# Patient Record
Sex: Male | Born: 1970
Health system: Southern US, Community
[De-identification: ages and names within clinical notes are randomized; demographics above are authoritative.]

## PROBLEM LIST (undated history)

## (undated) HISTORY — PX: NO PAST SURGERIES: SHX2092

---

## 2006-02-28 ENCOUNTER — Emergency Department (HOSPITAL_COMMUNITY): Admission: EM | Admit: 2006-02-28 | Discharge: 2006-03-01 | Payer: Self-pay | Admitting: Emergency Medicine

## 2006-09-22 ENCOUNTER — Ambulatory Visit: Payer: Self-pay | Admitting: Internal Medicine

## 2006-12-05 DIAGNOSIS — J343 Hypertrophy of nasal turbinates: Secondary | ICD-10-CM | POA: Insufficient documentation

## 2007-03-24 ENCOUNTER — Ambulatory Visit: Payer: Self-pay | Admitting: Nurse Practitioner

## 2007-03-29 ENCOUNTER — Ambulatory Visit: Payer: Self-pay | Admitting: Nurse Practitioner

## 2007-03-29 DIAGNOSIS — B009 Herpesviral infection, unspecified: Secondary | ICD-10-CM | POA: Insufficient documentation

## 2008-10-07 ENCOUNTER — Ambulatory Visit: Payer: Self-pay | Admitting: Nurse Practitioner

## 2008-10-07 DIAGNOSIS — S025XXA Fracture of tooth (traumatic), initial encounter for closed fracture: Secondary | ICD-10-CM | POA: Insufficient documentation

## 2008-10-11 ENCOUNTER — Encounter (INDEPENDENT_AMBULATORY_CARE_PROVIDER_SITE_OTHER): Payer: Self-pay | Admitting: Family Medicine

## 2014-10-10 ENCOUNTER — Emergency Department (HOSPITAL_COMMUNITY)
Admission: EM | Admit: 2014-10-10 | Discharge: 2014-10-11 | Disposition: A | Discharge status: Home / Self Care | Payer: No Typology Code available for payment source | Attending: Emergency Medicine | Admitting: Emergency Medicine

## 2014-10-10 ENCOUNTER — Encounter (HOSPITAL_COMMUNITY): Payer: Self-pay | Admitting: Emergency Medicine

## 2014-10-10 ENCOUNTER — Emergency Department (HOSPITAL_COMMUNITY): Payer: No Typology Code available for payment source

## 2014-10-10 DIAGNOSIS — S8002XA Contusion of left knee, initial encounter: Secondary | ICD-10-CM | POA: Insufficient documentation

## 2014-10-10 DIAGNOSIS — M545 Low back pain, unspecified: Secondary | ICD-10-CM

## 2014-10-10 DIAGNOSIS — Y999 Unspecified external cause status: Secondary | ICD-10-CM | POA: Diagnosis not present

## 2014-10-10 DIAGNOSIS — S8012XA Contusion of left lower leg, initial encounter: Secondary | ICD-10-CM | POA: Diagnosis not present

## 2014-10-10 DIAGNOSIS — S299XXA Unspecified injury of thorax, initial encounter: Secondary | ICD-10-CM | POA: Diagnosis not present

## 2014-10-10 DIAGNOSIS — S3992XA Unspecified injury of lower back, initial encounter: Secondary | ICD-10-CM | POA: Diagnosis present

## 2014-10-10 DIAGNOSIS — S199XXA Unspecified injury of neck, initial encounter: Secondary | ICD-10-CM | POA: Insufficient documentation

## 2014-10-10 DIAGNOSIS — Y92410 Unspecified street and highway as the place of occurrence of the external cause: Secondary | ICD-10-CM | POA: Insufficient documentation

## 2014-10-10 DIAGNOSIS — S8011XA Contusion of right lower leg, initial encounter: Secondary | ICD-10-CM | POA: Insufficient documentation

## 2014-10-10 DIAGNOSIS — Y9389 Activity, other specified: Secondary | ICD-10-CM | POA: Insufficient documentation

## 2014-10-10 DIAGNOSIS — S8001XA Contusion of right knee, initial encounter: Secondary | ICD-10-CM | POA: Diagnosis not present

## 2014-10-10 DIAGNOSIS — R1084 Generalized abdominal pain: Secondary | ICD-10-CM

## 2014-10-10 DIAGNOSIS — S0990XA Unspecified injury of head, initial encounter: Secondary | ICD-10-CM | POA: Insufficient documentation

## 2014-10-10 DIAGNOSIS — S3991XA Unspecified injury of abdomen, initial encounter: Secondary | ICD-10-CM | POA: Diagnosis not present

## 2014-10-10 MED ORDER — IOHEXOL 300 MG/ML  SOLN
100.0000 mL | Freq: Once | INTRAMUSCULAR | Status: AC | PRN
  Administered 2014-10-10: 100 mL via INTRAVENOUS

## 2014-10-10 MED ORDER — FENTANYL CITRATE (PF) 100 MCG/2ML IJ SOLN
50.0000 ug | Freq: Once | INTRAMUSCULAR | Status: AC
  Administered 2014-10-10: 50 ug via INTRAVENOUS
  Filled 2014-10-10: qty 2

## 2014-10-10 MED ORDER — SODIUM CHLORIDE 0.9 % IV SOLN
INTRAVENOUS | Status: DC
Start: 2014-10-10 — End: 2014-10-11
  Administered 2014-10-10: 23:00:00 via INTRAVENOUS

## 2014-10-10 MED ORDER — ONDANSETRON HCL 4 MG/2ML IJ SOLN
4.0000 mg | Freq: Once | INTRAMUSCULAR | Status: AC
  Administered 2014-10-10: 4 mg via INTRAVENOUS
  Filled 2014-10-10: qty 2

## 2014-10-10 MED ORDER — SODIUM CHLORIDE 0.9 % IV BOLUS (SEPSIS)
1000.0000 mL | Freq: Once | INTRAVENOUS | Status: AC
  Administered 2014-10-10: 1000 mL via INTRAVENOUS
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
## 2014-10-11 LAB — BASIC METABOLIC PANEL
Anion gap: 10 (ref 5–15)
BUN: 10 mg/dL (ref 6–20)
CALCIUM: 8.6 mg/dL — AB (ref 8.9–10.3)
CHLORIDE: 101 mmol/L (ref 101–111)
CO2: 24 mmol/L (ref 22–32)
CREATININE: 0.93 mg/dL (ref 0.61–1.24)
GFR calc Af Amer: >60 mL/min (ref >60)
GFR calc non Af Amer: >60 mL/min (ref >60)
Glucose, Bld: 112 mg/dL — ABNORMAL HIGH (ref 65–99)
Potassium: 3.2 mmol/L — ABNORMAL LOW (ref 3.5–5.1)
SODIUM: 135 mmol/L (ref 135–145)

## 2014-10-11 LAB — CBC WITH DIFFERENTIAL/PLATELET
Basophils Absolute: 0 10*3/uL (ref 0.0–0.1)
Basophils Relative: 0 % (ref 0–1)
EOS ABS: 0.1 10*3/uL (ref 0.0–0.7)
Eosinophils Relative: 1 % (ref 0–5)
HEMATOCRIT: 37.7 % — AB (ref 39.0–52.0)
HEMOGLOBIN: 13 g/dL (ref 13.0–17.0)
LYMPHS ABS: 1.9 10*3/uL (ref 0.7–4.0)
Lymphocytes Relative: 23 % (ref 12–46)
MCH: 29.8 pg (ref 26.0–34.0)
MCHC: 34.5 g/dL (ref 30.0–36.0)
MCV: 86.5 fL (ref 78.0–100.0)
MONO ABS: 1 10*3/uL (ref 0.1–1.0)
MONOS PCT: 13 % — AB (ref 3–12)
NEUTROS PCT: 63 % (ref 43–77)
Neutro Abs: 5.2 10*3/uL (ref 1.7–7.7)
Platelets: 285 10*3/uL (ref 150–400)
RBC: 4.36 MIL/uL (ref 4.22–5.81)
RDW: 14.8 % (ref 11.5–15.5)
WBC: 8.2 10*3/uL (ref 4.0–10.5)

## 2014-10-11 MED ORDER — FENTANYL CITRATE (PF) 100 MCG/2ML IJ SOLN
50.0000 ug | Freq: Once | INTRAMUSCULAR | Status: AC
  Administered 2014-10-11: 50 ug via INTRAVENOUS
  Filled 2014-10-11: qty 2

## 2014-10-11 MED ORDER — NAPROXEN 500 MG PO TABS: 500.0000 mg | ORAL_TABLET | Freq: Two times a day (BID) | ORAL | Status: DC

## 2014-10-11 MED ORDER — HYDROCODONE-ACETAMINOPHEN 5-325 MG PO TABS: 1.0000 | ORAL_TABLET | Freq: Four times a day (QID) | ORAL | Status: DC | PRN
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
## 2014-10-11 NOTE — Discharge Instructions (Signed)
Take Naprosyn on a regular basis. Supplement with hydrocodone as needed for additional pain control. Expect to be very stiff and sore the next few days. CT scan workup here to include head neck chest abdomen and pelvis was negative.  Work note provided.

## 2017-02-28 ENCOUNTER — Ambulatory Visit: Payer: Self-pay | Attending: Internal Medicine

## 2017-03-10 ENCOUNTER — Other Ambulatory Visit: Payer: Self-pay

## 2017-03-10 ENCOUNTER — Encounter (INDEPENDENT_AMBULATORY_CARE_PROVIDER_SITE_OTHER): Payer: Self-pay | Admitting: Physician Assistant

## 2017-03-10 ENCOUNTER — Ambulatory Visit (INDEPENDENT_AMBULATORY_CARE_PROVIDER_SITE_OTHER): Payer: Self-pay | Admitting: Physician Assistant

## 2017-03-10 VITALS — BP 159/77 | HR 71 | Temp 98.4°F | Ht 63.0 in | Wt 190.4 lb

## 2017-03-10 DIAGNOSIS — K089 Disorder of teeth and supporting structures, unspecified: Secondary | ICD-10-CM

## 2017-03-10 DIAGNOSIS — G8929 Other chronic pain: Secondary | ICD-10-CM

## 2017-03-10 DIAGNOSIS — B351 Tinea unguium: Secondary | ICD-10-CM

## 2017-03-10 MED ORDER — NAPROXEN 500 MG PO TABS: 500.0000 mg | ORAL_TABLET | Freq: Two times a day (BID) | ORAL | 0 refills | Status: DC

## 2017-03-10 MED ORDER — AMOXICILLIN-POT CLAVULANATE 875-125 MG PO TABS
1.0000 | ORAL_TABLET | Freq: Two times a day (BID) | ORAL | 2 refills | Status: DC
Start: 2017-03-10 — End: 2017-06-13

## 2017-03-10 MED ORDER — TERBINAFINE HCL 250 MG PO TABS: 250.0000 mg | ORAL_TABLET | Freq: Every day | ORAL | 0 refills | Status: DC

## 2017-03-10 MED ORDER — BENZOCAINE 10 % MT GEL: 1.0000 "application " | OROMUCOSAL | 0 refills | Status: DC | PRN

## 2017-03-10 NOTE — Progress Notes (Signed)
Subjective:  Patient ID: Benjamin Mccarthy, male    DOB: [birth year]  Age: 47 y.o. MRN: [id number]  CC: dental pain and nail problem  HPI Benjamin Mccarthy is a 47 y.o. male with no significant medical history presents with chronic dental pain since one year ago. Pain in the front lower teeth has intensified over the last two months. The two lower incisors are loose and the gum is severely receded. Painful to chew and drink. Extremely sensitive to air. Does not endorse CP, palpitations, f/c/n/v, suppuration, abscess, facial swelling, or swelling in the oral cavity.    Also complains of infection of the right thumb. Thinks he has a fungal infection of the right thumbnail. Initially lost his nail years ago when he struck it with a hammer. The nail grew in dystrophic and blackened. Has remained damaged ever since. Tried OTC antifungal solutions with no relief.        Outpatient Medications Prior to Visit  Medication Sig Dispense Refill  . HYDROcodone-acetaminophen (NORCO/VICODIN) 5-325 MG per tablet Take 1-2 tablets by mouth every 6 (six) hours as needed for moderate pain. 20 tablet 0  . naproxen (NAPROSYN) 500 MG tablet Take 1 tablet (500 mg total) by mouth 2 (two) times daily. 14 tablet 0   No facility-administered medications prior to visit.      ROS Review of Systems  Constitutional: Negative for chills, fever and malaise/fatigue.  HENT:       Dental pain  Eyes: Negative for blurred vision.  Respiratory: Negative for shortness of breath.   Cardiovascular: Negative for chest pain and palpitations.  Gastrointestinal: Negative for abdominal pain and nausea.  Genitourinary: Negative for dysuria and hematuria.  Musculoskeletal: Negative for joint pain and myalgias.  Skin: Negative for rash.       Blackened thumbnail.   Neurological: Negative for tingling and headaches.  Psychiatric/Behavioral: Negative for depression. The patient is not nervous/anxious.     Objective:  BP (!) 159/77 (BP  Location: Left Arm, Patient Position: Sitting, Cuff Size: Normal)   Pulse 71   Temp 98.4 F (36.9 C) (Oral)   Ht 5\' 3"  (1.6 m)   Wt 190 lb 6.4 oz (86.4 kg)   SpO2 96%   BMI 33.73 kg/m   BP/Weight 03/10/2017 10/11/2014 10/07/2008  Systolic BP 159 108 127  Diastolic BP 77 62 84  Wt. (Lbs) 190.4 - 182  BMI 33.73 - 33.03      Physical Exam  Constitutional: He is oriented to person, place, and time.  Well developed, well nourished, NAD, polite  HENT:  Head: Normocephalic and atraumatic.  No facial, neck, or oral cavity edema. The two lower incisors are loose and the gum is severely receded.   Eyes: No scleral icterus.  Neck: Normal range of motion. Neck supple. No thyromegaly present.  Cardiovascular: Normal rate, regular rhythm and normal heart sounds.  Pulmonary/Chest: Effort normal and breath sounds normal.  Musculoskeletal: He exhibits no edema.  Lymphadenopathy:    He has no cervical adenopathy.  Neurological: He is alert and oriented to person, place, and time. No cranial nerve deficit. Coordination normal.  Skin: Skin is warm and dry. No rash noted. No erythema. No pallor.  Right thumb with dystrophic and blackened nail.   Psychiatric: He has a normal mood and affect. His behavior is normal. Thought content normal.  Vitals reviewed.    Assessment & Plan:   1. Chronic dental pain - Patient referred to dentistry. Also gave patient advise to go to [email or web address]  to try and attain care with a dentist that would accept uninsured patients.  - amoxicillin-clavulanate (AUGMENTIN) 875-125 MG tablet; Take 1 tablet by mouth 2 (two) times daily.  Dispense: 20 tablet; Refill: 2 - naproxen (NAPROSYN) 500 MG tablet; Take 1 tablet (500 mg total) by mouth 2 (two) times daily with a meal.  Dispense: 30 tablet; Refill: 0 - benzocaine (ORAJEL) 10 % mucosal gel; Use as directed 1 application in the mouth or throat as needed for mouth pain.  Dispense: 5.3 g; Refill: 0  2. Onychomycosis -  Comprehensive metabolic panel - Begin terbinafine (LAMISIL) 250 MG tablet; Take 1 tablet (250 mg total) by mouth daily.  Dispense: 90 tablet; Refill: 0. Strongly advised to wait to hear of liver results before filling terbinafine. Patient voiced understanding.    Meds ordered this encounter  Medications  . terbinafine (LAMISIL) 250 MG tablet    Sig: Take 1 tablet (250 mg total) by mouth daily.    Dispense:  90 tablet    Refill:  0    Order Specific Question:   Supervising Provider    Answer:   Quentin Angst [id number]  . amoxicillin-clavulanate (AUGMENTIN) 875-125 MG tablet    Sig: Take 1 tablet by mouth 2 (two) times daily.    Dispense:  20 tablet    Refill:  2    Order Specific Question:   Supervising Provider    Answer:   Quentin Angst [id number]  . naproxen (NAPROSYN) 500 MG tablet    Sig: Take 1 tablet (500 mg total) by mouth 2 (two) times daily with a meal.    Dispense:  30 tablet    Refill:  0    Order Specific Question:   Supervising Provider    Answer:   Quentin Angst [id number]  . benzocaine (ORAJEL) 10 % mucosal gel    Sig: Use as directed 1 application in the mouth or throat as needed for mouth pain.    Dispense:  5.3 g    Refill:  0    Order Specific Question:   Supervising Provider    Answer:   Quentin Angst [[id number]]    Follow-up: Return in about 3 months (around 06/08/2017) for Full physical.   Loletta Specter PA

## 2017-03-10 NOTE — Patient Instructions (Addendum)
La direction del red para el dentista:  [email or web address]  Por favor no empiezes la Terbinafine antes de oir de los resultados del higado.    Infeccin por hongos en las uas (Fungal Nail Infection) La infeccin por hongos en las uas es una infeccin por hongos frecuente en las uas de los pies o de las manos. Este trastorno [employer] uas de los pies con ms frecuencia que las uas de las manos. Ms de Neomia Dear ua puede infectarse. Esta afeccin puede transmitirse de [place] persona a otra (es  contagiosa). CAUSAS La causa de esta afeccin es un hongo. Existen distintos tipos de hongos que pueden causar la infeccin. Estos hongos son ms frecuentes en las zonas hmedas y clidas. Si las manos o los pies entran en contacto con los hongos, se pueden introducir en una ruptura de las uas de las manos o de los pies y Games developer infeccin. FACTORES DE RIESGO Los siguientes factores pueden hacer que usted sea propenso a sufrir esta afeccin:  Ser varn.  Tener diabetes.  Ser Neomia Dear persona de edad avanzada.  Convivir con alguien que tiene hongos.  Caminar descalzo en zonas donde proliferan hongos, como duchas o vestuarios.  Tener mala circulacin.  Usar zapatos y calcetines que [employer].  Tener pie de atleta.  Tener una ua lastimada o antecedentes recientes de una ciruga de uas.  Tener psoriasis.  Debilitamiento del sistema de defensa del cuerpo (sistema inmunitario). SNTOMAS Los sntomas de esta afeccin incluyen lo siguiente:  Cyndia Diver plida sobre la ua.  Engrosamiento de la ua.  Una ua que se torna amarilla o [place].  Bordes de las uas rugosos o quebradizos.  Una ua que se cae.  Una ua que se ha desprendido del lecho ungueal. DIAGNSTICO Esta afeccin se diagnostica mediante un examen fsico. El mdico podr tomar una muestra de la ua para examinarla y Engineer, manufacturing si tiene hongos. TRATAMIENTO Las infecciones leves no necesitan tratamiento. Si tiene  [employer] uas, el tratamiento puede incluir lo siguiente:  Medicamentos antimicticos por va oral. Deber tomar los medicamentos durante algunas semanas o meses y no ver los resultados hasta despus de un largo [place]. Estos medicamentos pueden tener efectos secundarios. Consulte al [employer] a los que debe estar atento.  Cremas y esmaltes para uas antimicticos. Se pueden usar junto con los medicamentos antimicticos que se administran por va oral.  Tratamiento lser de las uas.  Ciruga para extirpar la ua. Esto puede ser [employer] casos ms graves de infecciones. El tratamiento es muy largo y la infeccin Metallurgist. INSTRUCCIONES PARA EL CUIDADO EN EL HOGAR Medicamentos  Tome o aplquese los medicamentos de venta libre y Science writer se lo haya indicado el mdico.  Consulte al mdico sobre el uso de pomadas mentoladas para las uas de [place]. Estilo de vida  No comparta elementos personales como toallas o cortauas.  Crtese las uas con frecuencia.  Lvese y squese las manos y los pies todos [place].  Use calcetines absorbentes y cmbiese los calcetines con frecuencia.  Use un tipo de calzado que permita que el aire [place], como sandalias o zapatillas de lona. Deseche los zapatos viejos.  Use guantes de goma si est trabajando con sus manos en lugares mojados.  No camine descalzo en duchas o vestuarios.  No concurra a un saln de cosmtica de uas si no usan instrumentos limpios.  No use uas artificiales. Instrucciones generales  Concurra a todas  las visitas de control como se lo haya indicado el mdico. Esto es importante.  Aplquese polvo antimictico en los pies y en los zapatos. SOLICITE ATENCIN MDICA SI: La infeccin no mejora o si empeora despus de varios meses. Esta informacin no tiene Theme park managercomo fin reemplazar el consejo del mdico. Asegrese de hacerle al mdico cualquier pregunta que  tenga. Document Released: 11/18/2004 Document Revised: 06/02/2015 Document Reviewed: 08/12/2014 Elsevier Interactive Patient Education  [employer]2018 Elsevier Inc.

## 2017-03-11 LAB — COMPREHENSIVE METABOLIC PANEL
ALT: 38 IU/L (ref 0–44)
AST: 31 IU/L (ref 0–40)
Albumin/Globulin Ratio: 1.3 (ref 1.2–2.2)
Albumin: 4.5 g/dL (ref 3.5–5.5)
Alkaline Phosphatase: 136 IU/L — ABNORMAL HIGH (ref 39–117)
BUN/Creatinine Ratio: 12 (ref 9–20)
BUN: 10 mg/dL (ref 6–24)
Bilirubin Total: 0.5 mg/dL (ref 0.0–1.2)
CALCIUM: 9.3 mg/dL (ref 8.7–10.2)
CO2: 22 mmol/L (ref 20–29)
CREATININE: 0.81 mg/dL (ref 0.76–1.27)
Chloride: 99 mmol/L (ref 96–106)
GFR calc Af Amer: 123 mL/min/{1.73_m2} (ref >59)
GFR, EST NON AFRICAN AMERICAN: 107 mL/min/{1.73_m2} (ref >59)
Globulin, Total: 3.6 g/dL (ref 1.5–4.5)
Glucose: 125 mg/dL — ABNORMAL HIGH (ref 65–99)
POTASSIUM: 4.2 mmol/L (ref 3.5–5.2)
Sodium: 139 mmol/L (ref 134–144)
Total Protein: 8.1 g/dL (ref 6.0–8.5)

## 2017-03-14 ENCOUNTER — Other Ambulatory Visit: Payer: Self-pay

## 2017-03-14 ENCOUNTER — Ambulatory Visit (INDEPENDENT_AMBULATORY_CARE_PROVIDER_SITE_OTHER): Payer: Self-pay | Admitting: Physician Assistant

## 2017-03-14 ENCOUNTER — Telehealth: Payer: Self-pay

## 2017-03-14 ENCOUNTER — Encounter (INDEPENDENT_AMBULATORY_CARE_PROVIDER_SITE_OTHER): Payer: Self-pay | Admitting: Physician Assistant

## 2017-03-14 VITALS — BP 116/70 | HR 61 | Temp 97.4°F | Wt 192.2 lb

## 2017-03-14 DIAGNOSIS — R7303 Prediabetes: Secondary | ICD-10-CM

## 2017-03-14 DIAGNOSIS — R739 Hyperglycemia, unspecified: Secondary | ICD-10-CM

## 2017-03-14 LAB — POCT GLYCOSYLATED HEMOGLOBIN (HGB A1C): Hemoglobin A1C: 6.4

## 2017-03-14 MED ORDER — METFORMIN HCL 500 MG PO TABS: 500.0000 mg | ORAL_TABLET | Freq: Two times a day (BID) | ORAL | 5 refills | Status: DC

## 2017-03-14 NOTE — Telephone Encounter (Signed)
Using pacific interpreter [phone]) Benjamin Mccarthy, patient informed of elevated glucose and the need for A1c to check for diabetes. Patient scheduled to come into clinic today for A1c. Maryjean Mornempestt S Roberts, CMA

## 2017-03-14 NOTE — Patient Instructions (Signed)
Metformin tablets Qu es este medicamento? La METFORMINA se [place] para tratar la diabetes tipo 2. Ayuda a Advice worker de Dispensing optician. El tratamiento se [place] con ejercicios y [place]. Este [employer] se puede usar solo o con otros medicamentos para la diabetes, incluyendo la insulina. Este medicamento puede ser utilizado para otros usos; si tiene alguna pregunta consulte con su proveedor de atencin mdica o con su farmacutico. MARCAS COMUNES: Glucophage Qu le debo informar a mi profesional de la salud antes de tomar este medicamento? Necesita saber si usted presenta alguno de los siguientes problemas o situaciones: -anemia -si consume bebidas alcohlicas con frecuencia -se deshidrata con facilidad -ataque cardiaco -insuficiencia cardiaca tratada con medicamentos -enfermedad renal -enfermedad heptica -ovarios poliqusticos -infeccin o lesin severa -vmito -una reaccin alrgica o inusual a la metformina, a otros medicamentos, alimentos, colorantes o conservantes -si est embarazada o buscando quedar embarazada -si est amamantando a un beb Cmo debo utilizar este medicamento? Tome este medicamento por va oral. Tmelo con alimentos. Trague las tabletas con agua. Siga las instrucciones de la etiqueta del [place]. Tome su medicamento a intervalos regulares. No tome su medicamento con una frecuencia mayor a la indicada. Hable con su pediatra para informarse acerca del uso de este medicamento en nios. Aunque este medicamento se puede recetar a nios tan pequeos como de 10 aos de edad en casos selectos, existen precauciones que deben tomarse. Sobredosis: Pngase en contacto inmediatamente con un centro toxicolgico o una sala de urgencia si usted cree que haya tomado demasiado medicamento. ATENCIN: [employer] es solo para usted. No comparta este medicamento con nadie. Qu sucede si me olvido de una dosis? Si olvida una dosis, tmela lo antes posible. Si  es casi la hora de su dosis siguiente, tome slo esa dosis. No tome dosis adicionales o dobles. Qu puede interactuar con este medicamento? No tome esta medicina con ninguno de los siguientes medicamentos: -dofetilida -gatifloxacino -ciertos agentes de contraste administrados antes de un procedimiento con rayos X, tomografas computadas (CT), MRI u otros procedimientos Esta medicina tambin puede interactuar con los siguientes medicamentos: -acetazolamida -ciertos medicamentos para infeccin por VIH o hepatitis, tales como adefovir, emtricitabina, entecavir, lamivudina o tenofovir -cimetidina -crizotinib -digoxina -diurticos -hormonas femeninas, como estrgenos o progestinas y pldoras anticonceptivas -glucopirrolato -isoniazida -lamotrigina -medicamentos para presin sangunea, enfermedad cardiaca, pulso cardiaco irregular -memantina -midodrina -metazolamida -morfina -cido nicotnico -fenotiazinas, tales como clorpromacina, mesoridazina, proclorperazina, tioridazina -fenitona -procainamida -propantelina -quinidina -quinina -ranitidina -ranolazina -medicamentos esteroideos, como prednisona o cortisona -medicamentos estimulantes para trastornos de Freight forwarder, perder peso o mantenerse despierto -medicamentos tiroideos -topiramato -trimetoprima -trospio -vancomicina -vandetanib -zonisamida Puede ser que esta lista no menciona todas las posibles interacciones. Informe a su profesional de [employer]	[place] de [employer] productos a base de hierbas, medicamentos de [place] o suplementos nutritivos que est tomando. Si usted fuma, consume bebidas alcohlicas o si utiliza drogas ilegales, indqueselo tambin a su profesional de [employer]	[place]. Algunas sustancias pueden interactuar con su medicamento. A qu debo estar atento al usar Coca[employer]a? Visite a su mdico o a su profesional de la salud para chequear su evolucin peridicamente. Un examen llamado HbA1C (A1C) ser monitoreado. Es  un simple examen de [place]. Mide su control de azcar en la sangre durante los ltimos 2 a 3 meses. Usted recibir [employer] cada 3 a 6 meses. Aprenda cmo controlar el nivel de azcar en la sangre. Aprenda a reconocer los sntomas de bajo y alto nivel de azcar en la sangre y cmo tratarlos.  Siempre lleve consigo una fuente rpida de azcar por si acaso experimenta sntomas de bajo nivel de azcar en la sangre. Ejemplos incluyen caramelos duros o tabletas de glucosa. Asegrese de que los miembros de su familia sepan que se puede ahogar si come o bebe mientras tiene sntomas graves de bajo nivel de azcar en la sangre, tales como convulsiones o prdida del conocimiento. Deben obtener ayuda mdica inmediatamente. Informe a su mdico o a su profesional de la salud si tiene alto nivel de Dispensing optician. Tal vez sea necesario cambiar la dosis de su medicamento. Si est enfermo o haciendo mucho ms ejercicio que el habitual, puede ser necesario cambiar la dosis de su medicamento. No se salte comidas. Pregunte a su mdico o a su profesional de la salud si debe evitar el consumo de alcohol. Muchos productos de venta libre para tos y resfros contienen azcar y alcohol. Estos pueden Magazine features editor de azcar en la sangre. Este medicamento puede provocar la ovulacin en mujeres premenopusicas que no tienen periodos menstruales regulares. Esto puede aumentar la posibilidad de [place]. No debe tomar este medicamento si se queda embarazada o si cree que est embarazada. Consulte a su mdico o su profesional de la salud sobre sus opciones anticonceptivas mientras est tomando Coca[employer]Cola. Si cree que est embarazada, consulte a su mdico o su profesional de la salud inmediatamente. Si va a someterse a una operacin, IRM (MRI), tomografa computarizada u otro procedimiento, informe a su mdico que est tomando Coca[employer]Cola. Usted podr necesitar dejar de tomar este medicamento antes del  procedimiento. Use una pulsera o cadena de identificacin mdica. Lleve consigo una tarjeta de identificacin con informacin sobre su enfermedad y Scientist, research (medical) de sus medicamentos y los horarios de las dosis. Qu efectos secundarios puedo tener al [employer] este medicamento? Efectos secundarios que debe informar a su mdico o a Barrister's clerk de la salud tan pronto como sea posible: -Chief of Staff como erupcin cutnea, picazn o urticarias, hinchazn de la cara, labios o lengua -problemas respiratorios -sensacin de desmayos o aturdimiento, cadas -dolores o molestias musculares -signos o sntomas de bajo nivel de azcar en la sangre tales como sentirse ansioso, confusin, mareos, aumento de apetito, debilidad o cansancio inusual, sudoracin, temblores, fro, irritabilidad, dolor de cabeza, visin borrosa, pulso cardaco rpido, prdida del conocimiento -pulso cardiaco irregular o lento -molestias o dolor de estmago inusual -cansancio o debilidad inusual Efectos secundarios que, por lo general, no requieren atencin mdica (debe informarlos a su mdico o a su profesional de la salud si persisten o si son molestos): -diarrea -dolor de cabeza Victorio Palm de estmago -sabor metlico en la boca -nuseas -molestias estomacales, gases Puede ser que esta lista no menciona todos los posibles efectos secundarios. Comunquese a su mdico por asesoramiento mdico [employer]. Usted puede informar los efectos secundarios a la FDA por telfono al 1-800-FDA-1088. Dnde debo guardar mi medicina? Mantngala fuera del alcance de los nios. Gurdela a [employer], entre 15 y 67 grados C (107 y 44 grados F). Protjala de la humedad y de Naval architect. Deseche todo el medicamento que no haya utilizado, despus de la fecha de vencimiento. ATENCIN: Este folleto es un resumen. Puede ser que no cubra toda la posible informacin. Si usted tiene preguntas acerca de esta medicina, consulte con su  mdico, su farmacutico o su profesional de Technical sales engineer.  2018 Elsevier/Gold Standard (2016-03-11 00:00:00) Plan de alimentacin para la prediabetes (Prediabetes Eating Plan) La prediabetes, tambin llamada intolerancia a la  glucosa o alteracin de la glucosa en ayunas, es una afeccin que eleva los niveles de azcar en la sangre (glucemia) por encima de lo normal. Seguir una dieta saludable puede ayudar a mantener la prediabetes bajo control, y tambin reduce el riesgo de tener diabetes tipo2 y cardiopata, que es ms alto en las personas que tienen esta afeccin. Junto con la actividad fsica habitual, una dieta saludable:  Promueve la prdida de [place].  Ayuda a Environmental consultant de Dispensing optician.  Ayuda a mejorar la forma en que el organismo [place] la insulina. QU DEBO SABER ACERCA DE ESTE PLAN DE [place]?  Use el ndice glucmico (IG) para planificar las comidas. El ndice le informa con qu rapidez un alimento elevar su nivel de azcar en la sangre. Elija los alimentos con bajo IG. Estos tardan ms tiempo en subir el nivel de azcar en la sangre.  Preste mucha atencin a la cantidad de hidratos de carbono que hay en los alimentos que consume. Los hidratos de carbono [employer] niveles de Dispensing optician.  Lleve un registro de la cantidad de caloras que ingiere. Ingerir la cantidad correcta de caloras lo ayudar a Development worker, international aid peso saludable. Bajar alrededor del 7por ciento del peso inicial puede ayudar a Product/process development scientist la diabetes tipo2.  Tal vez deba seguir Web designer. Esta incluye una gran cantidad de verduras, carnes magras o pescado, cereales integrales, frutas, as como aceites y grasas saludables.  QU ALIMENTOS PUEDO COMER? Cereales Cereales integrales, como panes, galletas, cereales y pastas de salvado o integrales. Avena sin azcar. Trigo burgol. Cebada. Quinua. Arroz integral. Tortillas o tacos de harina de maz o de salvado. [place] Valeda Malm.  [place]. Guisantes. Remolachas. Coliflor. Repollo. Brcoli. Zanahorias. Tomates. [place]. [place]. Hierbas. Pimientos. Cebollas. Pepinos. Repollitos de Bruselas. Frutas Frutos rojos. Bananas. Manzanas. Naranjas. Uvas. Papaya. Mango. Shell. Kiwi. Pomelo. Cerezas. Carnes y otras fuentes de protenas Mariscos. Carnes [place], entre ellas, pollo y [place] o cortes magros de carne de cerdo y de [place]. Tofu. Huevos. Los frutos secos. Frijoles. Lcteos Productos lcteos descremados o semidescremados, como yogur, queso cottage y [place]. [employer]. T. Caf. Gaseosas sin azcar o dietticas. Agua de [place]. Leche. Productos alternativos [place]s, como leche de soja o de [place]. Condimentos [place]. Salsa de pepinillos. [place] con bajo contenido de [place] y de Location manager. Salsa barbacoa con bajo contenido de grasa y de azcar. [place]onesa sin grasa o con bajo contenido de [place]. Dulces y postres [place] sin azcar o con bajo contenido de [place]. Helados y otros dulces congelados sin azcar o con bajo contenido de [place]. Grasas y Medical laboratory scientific officer. Nueces. Aceite de oliva. Los artculos mencionados arriba pueden no ser [employer] de las bebidas o los alimentos recomendados. Comunquese con el nutricionista para conocer ms opciones. QU ALIMENTOS NO SE RECOMIENDAN? Cereales Productos a base de [place] y de [place], como panes, pastas, bocadillos y cereales. Bebidas Bebidas azucaradas, como t helado y gaseosas con Location manager. Dulces y postres Productos de [place], [place] tortas, [place], [place], Museum/gallery exhibitions officer y tarta de [place]. Los artculos mencionados arriba pueden no ser [employer] de las bebidas y los alimentos que se Higher education careers adviser. Comunquese con el nutricionista para obtener ms informacin. Esta informacin no tiene Marine scientist el consejo del mdico. Asegrese de hacerle al mdico cualquier pregunta que tenga. Document Released: 10/30/2014 Document Revised:  10/30/2014 Document Reviewed: 03/06/2014 Elsevier Interactive Patient Education  2017 [employer].

## 2017-03-14 NOTE — Telephone Encounter (Signed)
-----   Message from Loletta Specteroger David Gomez, PA-C sent at 03/11/2017  8:18 AM EST ----- Will need A1c because his glucose is elevated.

## 2017-03-14 NOTE — Progress Notes (Signed)
   Subjective:  Patient ID: Benjamin Mccarthy, male    DOB: [birth year]  Age: 47 y.o. MRN: [id number]  CC: f/u glucose  HPI Benjamin Mccarthy is a [age] y.o. male with a medical history of chronic dental pain presents to have an A1c POCT drawn. He was incidentally found to have a glucose of 125 mg/dL. Does not endorse polydipsia, polyuria, polyphagia, fatigue, visual blurring, or tingling/numbness.       Outpatient Medications Prior to Visit  Medication Sig Dispense Refill  . amoxicillin-clavulanate (AUGMENTIN) 875-125 MG tablet Take 1 tablet by mouth 2 (two) times daily. 20 tablet 2  . benzocaine (ORAJEL) 10 % mucosal gel Use as directed 1 application in the mouth or throat as needed for mouth pain. 5.3 g 0  . HYDROcodone-acetaminophen (NORCO/VICODIN) 5-325 MG per tablet Take 1-2 tablets by mouth every 6 (six) hours as needed for moderate pain. 20 tablet 0  . naproxen (NAPROSYN) 500 MG tablet Take 1 tablet (500 mg total) by mouth 2 (two) times daily with a meal. 30 tablet 0  . terbinafine (LAMISIL) 250 MG tablet Take 1 tablet (250 mg total) by mouth daily. 90 tablet 0   No facility-administered medications prior to visit.      ROS Review of Systems  Constitutional: Negative for chills, fever and malaise/fatigue.  Eyes: Negative for blurred vision.  Respiratory: Negative for shortness of breath.   Cardiovascular: Negative for chest pain and palpitations.  Gastrointestinal: Negative for abdominal pain and nausea.  Genitourinary: Negative for dysuria and hematuria.  Musculoskeletal: Negative for joint pain and myalgias.  Skin: Negative for rash.  Neurological: Negative for tingling and headaches.  Psychiatric/Behavioral: Negative for depression. The patient is not nervous/anxious.     Objective:  There were no vitals taken for this visit.  BP/Weight 03/10/2017 10/11/2014 10/07/2008  Systolic BP 159 108 127  Diastolic BP 77 62 84  Wt. (Lbs) 190.4 - 182  BMI 33.73 - 33.03      Physical Exam   Constitutional: He is oriented to person, place, and time.  Well developed, well nourished, NAD, polite  HENT:  Head: Normocephalic and atraumatic.  Eyes: No scleral icterus.  Neck: Normal range of motion. Neck supple. No thyromegaly present.  Pulmonary/Chest: Effort normal.  Neurological: He is alert and oriented to person, place, and time.  Skin: Skin is warm and dry. No rash noted. No erythema. No pallor.  Right thumb nail dystrophic and blackened  Psychiatric: He has a normal mood and affect. His behavior is normal. Thought content normal.  Vitals reviewed.    Assessment & Plan:     1. Prediabetes - Begin metFORMIN (GLUCOPHAGE) 500 MG tablet; Take 1 tablet (500 mg total) by mouth 2 (two) times daily with a meal.  Dispense: 60 tablet; Refill: 5  2. Hyperglycemia - HgB A1c 6.4% in clinic today      Meds ordered this encounter  Medications  . metFORMIN (GLUCOPHAGE) 500 MG tablet    Sig: Take 1 tablet (500 mg total) by mouth 2 (two) times daily with a meal.    Dispense:  60 tablet    Refill:  5    Order Specific Question:   Supervising Provider    Answer:   Quentin AngstJEGEDE, OLUGBEMIGA E [[id number]]    Follow-up: Return for keep f/u appt.   Loletta Specteroger David Elizar Alpern PA

## 2017-06-13 ENCOUNTER — Ambulatory Visit (INDEPENDENT_AMBULATORY_CARE_PROVIDER_SITE_OTHER): Payer: Self-pay | Admitting: Physician Assistant

## 2017-06-13 ENCOUNTER — Other Ambulatory Visit: Payer: Self-pay

## 2017-06-13 ENCOUNTER — Encounter (INDEPENDENT_AMBULATORY_CARE_PROVIDER_SITE_OTHER): Payer: Self-pay | Admitting: Physician Assistant

## 2017-06-13 VITALS — BP 139/89 | HR 86 | Temp 98.1°F | Ht 63.0 in | Wt 186.4 lb

## 2017-06-13 DIAGNOSIS — Z23 Encounter for immunization: Secondary | ICD-10-CM

## 2017-06-13 DIAGNOSIS — Z114 Encounter for screening for human immunodeficiency virus [HIV]: Secondary | ICD-10-CM

## 2017-06-13 DIAGNOSIS — K089 Disorder of teeth and supporting structures, unspecified: Secondary | ICD-10-CM

## 2017-06-13 DIAGNOSIS — K047 Periapical abscess without sinus: Secondary | ICD-10-CM

## 2017-06-13 DIAGNOSIS — G8929 Other chronic pain: Secondary | ICD-10-CM

## 2017-06-13 MED ORDER — NAPROXEN 500 MG PO TABS
500.0000 mg | ORAL_TABLET | Freq: Two times a day (BID) | ORAL | 3 refills | Status: DC
Start: 2017-06-13 — End: 2017-10-03

## 2017-06-13 MED ORDER — ACETAMINOPHEN-CODEINE #3 300-30 MG PO TABS: 1.0000 | ORAL_TABLET | Freq: Four times a day (QID) | ORAL | 0 refills | Status: AC | PRN

## 2017-06-13 MED ORDER — AMOXICILLIN-POT CLAVULANATE 875-125 MG PO TABS: 1.0000 | ORAL_TABLET | Freq: Two times a day (BID) | ORAL | 3 refills | Status: DC

## 2017-06-13 NOTE — Patient Instructions (Addendum)
Por favor llame a [facility] [phone]  Absceso dental (Dental Abscess) Un absceso dental es pus en una pieza dental o alrededor de esta. CUIDADOS EN EL HOGAR  Tome los medicamentos solamente como se lo haya indicado el dentista.  Si le recetaron antibiticos, asegrese de terminarlos, incluso si comienza a Actorsentirse mejor.  Enjuguese la boca (haga grgaras) frecuentemente con agua con sal.  No conduzca ni use maquinaria pesada, como una cortadora de csped, mientras est tomando analgsicos.  No se aplique calor en la parte externa de la boca.  Concurra a todas las visitas de control como se lo haya indicado el dentista. Esto es importante. SOLICITE AYUDA SI:  El dolor [place]empeora, y los medicamentos no surten Westboroefecto. SOLICITE AYUDA DE INMEDIATO SI:  Tiene fiebre o siente escalofros.  Los sntomas empeoran repentinamente.  Siente un dolor de cabeza muy intenso.  Tiene problemas para respirar o tragar.  Tiene dificultad para abrir Government social research officerla boca.  Tiene inflamacin (hinchazn) en el cuello o alrededor del ojo. Esta informacin no tiene Theme park managercomo fin reemplazar el consejo del mdico. Asegrese de hacerle al mdico cualquier pregunta que tenga. Document Released: 06/25/2014 Document Revised: 06/25/2014 Document Reviewed: 02/05/2014 Elsevier Interactive Patient Education  [employer]2018 Elsevier Inc.

## 2017-06-13 NOTE — Progress Notes (Signed)
Subjective:  Patient ID: Benjamin Mccarthy, male    DOB: Sep 12, 1970  Age: 47 y.o. MRN: [id number]  CC: dental pain  HPI Benjamin Mccarthy is a 47 y.o. male with a medical history of prediabetes, onychomycosis, and chronic dental pain presents with dental pain in the right upper molars. Says he has severe pain since last night with mild swelling of the cheek and radiation of pain to the upper cheek. Has a hole at the base of the affected tooth. Used a piece of garlic to plug the hole with relief of pain. Also took tylenol with minimal relief. No swelling of the throat, tongue, or neck. No fever, chills, bleeding, or suppuration.       Outpatient Medications Prior to Visit  Medication Sig Dispense Refill  . benzocaine (ORAJEL) 10 % mucosal gel Use as directed 1 application in the mouth or throat as needed for mouth pain. 5.3 g 0  . HYDROcodone-acetaminophen (NORCO/VICODIN) 5-325 MG per tablet Take 1-2 tablets by mouth every 6 (six) hours as needed for moderate pain. 20 tablet 0  . metFORMIN (GLUCOPHAGE) 500 MG tablet Take 1 tablet (500 mg total) by mouth 2 (two) times daily with a meal. 60 tablet 5  . naproxen (NAPROSYN) 500 MG tablet Take 1 tablet (500 mg total) by mouth 2 (two) times daily with a meal. 30 tablet 0  . terbinafine (LAMISIL) 250 MG tablet Take 1 tablet (250 mg total) by mouth daily. 90 tablet 0  . amoxicillin-clavulanate (AUGMENTIN) 875-125 MG tablet Take 1 tablet by mouth 2 (two) times daily. 20 tablet 2   No facility-administered medications prior to visit.      ROS Review of Systems  Constitutional: Negative for chills, fever and malaise/fatigue.  HENT:       Tooth pain.  Eyes: Negative for blurred vision.  Respiratory: Negative for shortness of breath.   Cardiovascular: Negative for chest pain and palpitations.  Gastrointestinal: Negative for abdominal pain and nausea.  Genitourinary: Negative for dysuria and hematuria.  Musculoskeletal: Negative for joint pain and myalgias.   Skin: Negative for rash.  Neurological: Negative for tingling and headaches.  Psychiatric/Behavioral: Negative for depression. The patient is not nervous/anxious.     Objective:  BP 139/89 (BP Location: Right Arm, Patient Position: Sitting, Cuff Size: Normal)   Pulse 86   Temp 98.1 F (36.7 C) (Oral)   Ht 5\' 3"  (1.6 m)   Wt 186 lb 6.4 oz (84.6 kg)   SpO2 98%   BMI 33.02 kg/m   BP/Weight 06/13/2017 03/14/2017 03/10/2017  Systolic BP 139 116 159  Diastolic BP 89 70 77  Wt. (Lbs) 186.4 192.2 190.4  BMI 33.02 34.05 33.73      Physical Exam  Constitutional: He is oriented to person, place, and time.  Well developed, well nourished, NAD, polite  HENT:  Head: Normocephalic and atraumatic.  Tooth number three with moderately severe decay and a perforation at the base of tooth at gumline. No bleeding, suppuration, or swelling.  Eyes: No scleral icterus.  Neck: Normal range of motion. Neck supple. No thyromegaly present.  Cardiovascular: Normal rate, regular rhythm and normal heart sounds.  Pulmonary/Chest: Effort normal and breath sounds normal.  Musculoskeletal: He exhibits no edema.  Neurological: He is alert and oriented to person, place, and time.  Skin: Skin is warm and dry. No rash noted. No erythema. No pallor.  Psychiatric: He has a normal mood and affect. His behavior is normal. Thought content normal.  Vitals reviewed.  Assessment & Plan:   1. Dental abscess - amoxicillin-clavulanate (AUGMENTIN) 875-125 MG tablet; Take 1 tablet by mouth 2 (two) times daily.  Dispense: 20 tablet; Refill: 3 - naproxen (NAPROSYN) 500 MG tablet; Take 1 tablet (500 mg total) by mouth 2 (two) times daily with a meal.  Dispense: 30 tablet; Refill: 3 - acetaminophen-codeine (TYLENOL #3) 300-30 MG tablet; Take 1 tablet by mouth every 6 (six) hours as needed for up to 7 days for moderate pain.  Dispense: 28 tablet; Refill: 0  2. Chronic dental pain - Gave brochure printout containing  [facility].   3. Screening for HIV (human immunodeficiency virus) - HIV antibody  4. Need for Tdap vaccination - Tdap vaccine greater than or equal to 7yo IM; Future. Sent to [facility] as we are out of stock.   Meds ordered this encounter  Medications  . amoxicillin-clavulanate (AUGMENTIN) 875-125 MG tablet    Sig: Take 1 tablet by mouth 2 (two) times daily.    Dispense:  20 tablet    Refill:  3    Order Specific Question:   Supervising Provider    Answer:   Quentin Angst [id number]  . naproxen (NAPROSYN) 500 MG tablet    Sig: Take 1 tablet (500 mg total) by mouth 2 (two) times daily with a meal.    Dispense:  30 tablet    Refill:  3    Order Specific Question:   Supervising Provider    Answer:   Quentin Angst [id number]  . acetaminophen-codeine (TYLENOL #3) 300-30 MG tablet    Sig: Take 1 tablet by mouth every 6 (six) hours as needed for up to 7 days for moderate pain.    Dispense:  28 tablet    Refill:  0    Order Specific Question:   Supervising Provider    Answer:   Quentin Angst [id number]    Follow-up: Return if symptoms worsen or fail to improve.   Loletta Specter PA

## 2017-06-14 LAB — HIV ANTIBODY (ROUTINE TESTING W REFLEX): HIV Screen 4th Generation wRfx: NONREACTIVE

## 2017-06-15 ENCOUNTER — Telehealth (INDEPENDENT_AMBULATORY_CARE_PROVIDER_SITE_OTHER): Payer: Self-pay

## 2017-06-15 NOTE — Telephone Encounter (Signed)
Patient aware of negative HIV. Benjamin Mccarthy [name], CMA  

## 2017-06-15 NOTE — Telephone Encounter (Signed)
-----   Message from Loletta Specteroger David Gomez, PA-C sent at 06/14/2017  8:15 AM EDT ----- HIV negative.

## 2017-09-07 ENCOUNTER — Ambulatory Visit: Payer: Self-pay | Attending: Physician Assistant

## 2017-10-03 ENCOUNTER — Ambulatory Visit (INDEPENDENT_AMBULATORY_CARE_PROVIDER_SITE_OTHER): Payer: Self-pay | Admitting: Physician Assistant

## 2017-10-03 ENCOUNTER — Encounter (INDEPENDENT_AMBULATORY_CARE_PROVIDER_SITE_OTHER): Payer: Self-pay | Admitting: Physician Assistant

## 2017-10-03 ENCOUNTER — Other Ambulatory Visit: Payer: Self-pay

## 2017-10-03 VITALS — BP 126/78 | HR 68 | Temp 98.0°F | Ht 63.0 in | Wt 198.4 lb

## 2017-10-03 DIAGNOSIS — R7303 Prediabetes: Secondary | ICD-10-CM

## 2017-10-03 DIAGNOSIS — G8929 Other chronic pain: Secondary | ICD-10-CM

## 2017-10-03 DIAGNOSIS — E119 Type 2 diabetes mellitus without complications: Secondary | ICD-10-CM

## 2017-10-03 DIAGNOSIS — Z23 Encounter for immunization: Secondary | ICD-10-CM

## 2017-10-03 DIAGNOSIS — K089 Disorder of teeth and supporting structures, unspecified: Secondary | ICD-10-CM

## 2017-10-03 LAB — POCT GLYCOSYLATED HEMOGLOBIN (HGB A1C): Hemoglobin A1C: 6.8 % — AB (ref 4.0–5.6)

## 2017-10-03 MED ORDER — BENZOCAINE 10 % MT GEL: 1.0000 "application " | OROMUCOSAL | 0 refills | Status: DC | PRN

## 2017-10-03 MED ORDER — NAPROXEN 500 MG PO TABS: 500.0000 mg | ORAL_TABLET | Freq: Two times a day (BID) | ORAL | 3 refills | Status: DC

## 2017-10-03 MED ORDER — METFORMIN HCL 1000 MG PO TABS: 1000.0000 mg | ORAL_TABLET | Freq: Two times a day (BID) | ORAL | 3 refills | Status: DC

## 2017-10-03 NOTE — Progress Notes (Signed)
Subjective:  Patient ID: Benjamin Mccarthy, male    DOB: 02/04/1971  Age: 47 y.o. MRN: [id number]  CC: Dentist referral   HPI  Benjamin Mccarthy is a 47 y.o. male with a medical history of prediabetes, onychomycosis, and chronic dental pain presents with right upper wisdom tooth pain. Pain currently at 5/10. Advised to use Naproxen 500 mg BID and Orajel with temporary resolution of the pain. [facility]s [facility] now that he has the orange card.      Pt's last A1c 6.4% seven months ago. Took Metformin as directed for six months but ran out. A1c 6.8% today in clinic. Tries to keep a lower carb diet. Works long hours in [employer] and does not have time to exercise regularly. Does not endorse polydipsia, polyuria, fatigue, tingling, numbness, fatigue, abdominal pain, CP, palpitations, SOB, f/c/n/v, rash, edema, or GI/GU sxs.    Outpatient Medications Prior to Visit  Medication Sig Dispense Refill  . benzocaine (ORAJEL) 10 % mucosal gel Use as directed 1 application in the mouth or throat as needed for mouth pain. (Patient not taking: Reported on 10/03/2017) 5.3 g 0  . metFORMIN (GLUCOPHAGE) 500 MG tablet Take 1 tablet (500 mg total) by mouth 2 (two) times daily with a meal. (Patient not taking: Reported on 10/03/2017) 60 tablet 5  . naproxen (NAPROSYN) 500 MG tablet Take 1 tablet (500 mg total) by mouth 2 (two) times daily with a meal. (Patient not taking: Reported on 10/03/2017) 30 tablet 3  . terbinafine (LAMISIL) 250 MG tablet Take 1 tablet (250 mg total) by mouth daily. (Patient not taking: Reported on 10/03/2017) 90 tablet 0  . amoxicillin-clavulanate (AUGMENTIN) 875-125 MG tablet Take 1 tablet by mouth 2 (two) times daily. 20 tablet 3   No facility-administered medications prior to visit.      ROS Review of Systems  Constitutional: Negative for chills, fever and malaise/fatigue.  Eyes: Negative for blurred vision.  Respiratory: Negative for shortness of breath.    Cardiovascular: Negative for chest pain and palpitations.  Gastrointestinal: Negative for abdominal pain and nausea.  Genitourinary: Negative for dysuria and hematuria.  Musculoskeletal: Negative for joint pain and myalgias.  Skin: Negative for rash.  Neurological: Negative for tingling and headaches.  Psychiatric/Behavioral: Negative for depression. The patient is not nervous/anxious.     Objective:  BP 126/78 (BP Location: Left Arm, Patient Position: Sitting, Cuff Size: Normal)   Pulse 68   Temp 98 F (36.7 C) (Oral)   Ht 5\' 3"  (1.6 m)   Wt 198 lb 6.4 oz (90 kg)   SpO2 96%   BMI 35.14 kg/m   BP/Weight 10/03/2017 06/13/2017 03/14/2017  Systolic BP 126 139 116  Diastolic BP 78 89 70  Wt. (Lbs) 198.4 186.4 192.2  BMI 35.14 33.02 34.05      Physical Exam  Constitutional: He is oriented to person, place, and time.  Well developed, well nourished, NAD, polite  HENT:  Head: Normocephalic and atraumatic.  Eyes: No scleral icterus.  Neck: Normal range of motion. Neck supple. No thyromegaly present.  Cardiovascular: Normal rate, regular rhythm and normal heart sounds.  Pulmonary/Chest: Effort normal and breath sounds normal.  Abdominal: Soft. Bowel sounds are normal. There is no tenderness.  Musculoskeletal: He exhibits no edema.  Neurological: He is alert and oriented to person, place, and time.  Skin: Skin is warm and dry. No rash noted. No erythema. No pallor.  Psychiatric: He has a normal mood and affect. His behavior is  normal. Thought content normal.  Vitals reviewed.    Assessment & Plan:   1. Type 2 diabetes mellitus without complication, without long-term current use of insulin (HCC)  - HgB A1c 6.8% - Increase metFORMIN (GLUCOPHAGE) 1000 MG tablet; Take 1 tablet (1,000 mg total) by mouth 2 (two) times daily with a meal.  Dispense: 180 tablet; Refill: 3 - Microalbumin / creatinine urine ratio - Lipid panel; Future - Comprehensive metabolic panel; Future  3.  Chronic dental pain - naproxen (NAPROSYN) 500 MG tablet; Take 1 tablet (500 mg total) by mouth 2 (two) times daily with a meal.  Dispense: 30 tablet; Refill: 3 - benzocaine (ORAJEL) 10 % mucosal gel; Use as directed 1 application in the mouth or throat as needed for mouth pain.  Dispense: 5.3 g; Refill: 0 - Ambulatory referral to Dentistry  4. Need for Tdap vaccination - Tdap vaccine greater than or equal to 7yo IM   Meds ordered this encounter  Medications  . naproxen (NAPROSYN) 500 MG tablet    Sig: Take 1 tablet (500 mg total) by mouth 2 (two) times daily with a meal.    Dispense:  30 tablet    Refill:  3    Order Specific Question:   Supervising Provider    Answer:   Hoy RegisterNEWLIN, ENOBONG [[id number]]  . benzocaine (ORAJEL) 10 % mucosal gel    Sig: Use as directed 1 application in the mouth or throat as needed for mouth pain.    Dispense:  5.3 g    Refill:  0    Order Specific Question:   Supervising Provider    Answer:   Hoy RegisterNEWLIN, ENOBONG [[id number]]  . metFORMIN (GLUCOPHAGE) 1000 MG tablet    Sig: Take 1 tablet (1,000 mg total) by mouth 2 (two) times daily with a meal.    Dispense:  180 tablet    Refill:  3    Order Specific Question:   Supervising Provider    Answer:   Hoy RegisterNEWLIN, ENOBONG [[id number]]    Follow-up: Return in about 3 months (around 01/03/2018) for Diabetes.   Loletta Specteroger David Gomez PA

## 2017-10-03 NOTE — Patient Instructions (Signed)
Diabetes mellitus y nutrición  Diabetes Mellitus and Nutrition  Si sufre de diabetes (diabetes mellitus), es muy importante tener hábitos alimenticios saludables debido a que sus niveles de azúcar en la sangre (glucosa) se ven afectados en gran medida por lo que come y bebe. Comer alimentos saludables en las cantidades adecuadas, aproximadamente a la misma hora todos los días, lo ayudará a:  · Controlar la glucemia.  · Disminuir el riesgo de sufrir una enfermedad cardíaca.  · Mejorar la presión arterial.  · Alcanzar o mantener un peso saludable.    Todas las personas que sufren de diabetes son diferentes y cada una tiene necesidades diferentes en cuanto a un plan de alimentación. El médico puede recomendarle que trabaje con un especialista en dietas y nutrición (nutricionista) para elaborar el mejor plan para usted. Su plan de alimentación puede variar según factores como:  · Las calorías que necesita.  · Los medicamentos que toma.  · Su peso.  · Sus niveles de glucemia, presión arterial y colesterol.  · Su nivel de actividad.  · Otras afecciones que tenga, como enfermedades cardíacas o renales.    ¿Cómo me afectan los carbohidratos?  Los carbohidratos afectan el nivel de glucemia más que cualquier otro tipo de alimento. La ingesta de carbohidratos naturalmente aumenta la cantidad glucosa en la sangre. El recuento de carbohidratos es un método destinado a llevar un registro de la cantidad de carbohidratos que se ingieren. El recuento de carbohidratos es importante para mantener la glucemia a un nivel saludable, en especial si utiliza insulina o toma determinados medicamentos por vía oral para la diabetes.  Es importante saber la cantidad de carbohidratos que se pueden ingerir en cada comida sin correr ningún riesgo. Esto es diferente en cada persona. El nutricionista puede ayudarlo a calcular la cantidad de carbohidratos que debe ingerir en cada comida y colación.   Los alimentos que contienen carbohidratos incluyen:  · Pan, cereal, arroz, pasta y galletas.  · Papas y maíz.  · Guisantes, frijoles y lentejas.  · Leche y yogur.  · Frutas y jugo.  · Postres, como pasteles, galletitas, helado y caramelos.    ¿Cómo me afecta el alcohol?  El alcohol puede provocar disminuciones súbitas de la glucemia (hipoglucemia), en especial si utiliza insulina o toma determinados medicamentos por vía oral para la diabetes. La hipoglucemia es una afección potencialmente mortal. Los síntomas de la hipoglucemia (somnolencia, mareos y confusión) son similares a los síntomas de haber consumido demasiado alcohol.  Si el médico afirma que el alcohol es seguro para usted, siga estas pautas:  · Limite el consumo de alcohol a no más de 1 medida por día si es mujer y no está embarazada, y a 2 medidas si es hombre. Una medida equivale a 12 oz (355 ml) de cerveza, 5 oz (148 ml) de vino o 1½ oz (44 ml) de bebidas de alta graduación alcohólica.  · No beba con el estómago vacío.  · Manténgase hidratado con agua, gaseosas dietéticas o té helado sin azúcar.  · Tenga en cuenta que las gaseosas comunes, los jugos y otros refrescos pueden contener mucha azúcar y se deben contar como carbohidratos.    Consejos para seguir este plan  Leer las etiquetas de los alimentos  · Comience por controlar el tamaño de la porción en la etiqueta. La cantidad de calorías, carbohidratos, grasas y otros nutrientes mencionados en la etiqueta se basan en una porción del alimento. Muchos alimentos contienen más de una porción por envase.  · Verifique la cantidad total de gramos (g)   de carbohidratos totales en una porción. Puede calcular la cantidad de porciones de carbohidratos al dividir el total de carbohidratos por 15. Por ejemplo, si un alimento posee un total de 30 g de carbohidratos, equivale a 2 porciones de carbohidratos.  · Verifique la cantidad de gramos (g) de grasas saturadas y grasas trans  en una porción. Escoja alimentos que no contengan grasa o que tengan un bajo contenido.  · Controle la cantidad de miligramos (mg) de sodio en una porción. La mayoría de las personas deben limitar la ingesta de sodio total a menos de 2300 mg por día.  · Siempre consulte la información nutricional de los alimentos etiquetados como “con bajo contenido de grasa” o “sin grasa”. Estos alimentos pueden ser más altos en azúcar agregada o en carbohidratos refinados y deben evitarse.  · Hable con el nutricionista para identificar sus objetivos diarios en cuanto a los nutrientes mencionados en la etiqueta.  De compras  · Evite comprar alimentos procesados, enlatados o prehechos. Estos alimentos tienden a tener mayor cantidad de grasa, sodio y azúcar agregada.  · Compre en la zona exterior de la tienda de comestibles. Esta incluye frutas y vegetales frescos, granos a granel, carnes frescas y productos lácteos frescos.  Cocción  · Utilice métodos de cocción a baja temperatura, como hornear, en lugar de métodos de cocción a alta temperatura, como freír en abundante aceite.  · Cocine con aceites saludables, como el aceite de oliva, canola o girasol.  · Evite cocinar con manteca, crema o carnes con alto contenido de grasa.  Planificación de las comidas  · Consuma las comidas y las colaciones de forma regular, preferentemente a la misma hora todos los días. Evite pasar largos períodos de tiempo sin comer.  · Consuma alimentos ricos en fibra, como frutas frescas, verduras, frijoles y cereales integrales. Consulte al nutricionista sobre cuántas porciones de carbohidratos puede consumir en cada comida.  · Consuma entre 4 y 6 onzas de proteínas magras por día, como carnes magras, pollo, pescado, huevos o tofu. 1 onza equivale a 1 onza de carne, pollo o pescado, 1 huevo, o 1/4 taza de tofu.  · Coma algunos alimentos por día que contengan grasas saludables, como aguacates, frutos secos, semillas y pescado.  Estilo de vida     · Controle su nivel de glucemia con regularidad.  · Haga ejercicio al menos 30 minutos, 5 días o más por semana, o como se lo haya indicado el médico.  · Tome los medicamentos como se lo haya indicado el médico.  · No consuma ningún producto que contenga nicotina o tabaco, como cigarrillos y cigarrillos electrónicos. Si necesita ayuda para dejar de fumar, consulte al médico.  · Trabaje con un asesor o instructor en diabetes para identificar estrategias para controlar el estrés y cualquier desafío emocional y social.  ¿Cuáles son algunas de las preguntas que puedo hacerle a mi médico?  · ¿Es necesario que me reúna con un instructor en diabetes?  · ¿Es necesario que me reúna con un nutricionista?  · ¿A qué número puedo llamar si tengo preguntas?  · ¿Cuáles son los mejores momentos para controlar la glucemia?  Dónde encontrar más información:  · Asociación Americana de la Diabetes (American Diabetes Association): diabetes.org/food-and-fitness/food  · Academia de Nutrición y Dietética (Academy of Nutrition and Dietetics): www.eatright.org/resources/health/diseases-and-conditions/diabetes  · Instituto Nacional de la Diabetes y las Enfermedades Digestivas y Renales (National Institute of Diabetes and Digestive and Kidney Diseases) (Institutos Nacionales de Salud, NIH): www.niddk.nih.gov/health-information/diabetes/overview/diet-eating-physical-activity  Resumen  · Un plan de alimentación saludable   lo ayudará a controlar la glucemia y mantener un estilo de vida saludable.  · Trabajar con un especialista en dietas y nutrición (nutricionista) puede ayudarlo a elaborar el mejor plan de alimentación para usted.  · Tenga en cuenta que los carbohidratos y el alcohol tienen efectos inmediatos en sus niveles de glucemia. Es importante contar los carbohidratos y consumir alcohol con prudencia.  Esta información no tiene como fin reemplazar el consejo del médico. Asegúrese de hacerle al médico cualquier pregunta que tenga.   Document Released: 05/18/2007 Document Revised: 05/31/2016 Document Reviewed: 05/31/2016  Elsevier Interactive Patient Education © 2018 Elsevier Inc.

## 2017-10-04 LAB — MICROALBUMIN / CREATININE URINE RATIO
CREATININE, UR: 70.1 mg/dL
MICROALBUM., U, RANDOM: 4.8 ug/mL
Microalb/Creat Ratio: 6.8 mg/g creat (ref 0.0–30.0)

## 2017-10-06 ENCOUNTER — Telehealth (INDEPENDENT_AMBULATORY_CARE_PROVIDER_SITE_OTHER): Payer: Self-pay

## 2017-10-06 NOTE — Telephone Encounter (Signed)
Call placed using [employer] interpreter [phone]([phone]) patient has a voicemail box that has not been setup yet. Will call patient once more before mailing results. Maryjean Mornempestt S Roberts, CMA

## 2017-10-06 NOTE — Telephone Encounter (Signed)
-----   Message from Loletta Specteroger David Gomez, PA-C sent at 10/04/2017  1:37 PM EDT ----- Normal urinary proteins.

## 2017-10-07 ENCOUNTER — Telehealth (INDEPENDENT_AMBULATORY_CARE_PROVIDER_SITE_OTHER): Payer: Self-pay

## 2017-10-07 NOTE — Telephone Encounter (Signed)
Call placed using [facility]pre[facility] [phone]([phone]) patient aware that urinary proteins are normal. Maryjean Mornempestt S Roberts, CMA

## 2017-10-07 NOTE — Telephone Encounter (Signed)
-----   Message from Roger David Gomez, PA-C sent at 10/04/2017  1:37 PM EDT ----- Normal urinary proteins. 

## 2018-01-03 ENCOUNTER — Ambulatory Visit (INDEPENDENT_AMBULATORY_CARE_PROVIDER_SITE_OTHER): Payer: Self-pay | Admitting: Physician Assistant

## 2018-04-24 ENCOUNTER — Ambulatory Visit: Payer: Self-pay | Attending: Family Medicine

## 2018-05-10 ENCOUNTER — Ambulatory Visit (INDEPENDENT_AMBULATORY_CARE_PROVIDER_SITE_OTHER): Payer: Self-pay | Admitting: Primary Care

## 2019-04-30 ENCOUNTER — Encounter: Payer: Self-pay | Admitting: Nurse Practitioner

## 2019-04-30 ENCOUNTER — Ambulatory Visit: Payer: Self-pay | Attending: Nurse Practitioner | Admitting: Nurse Practitioner

## 2019-04-30 ENCOUNTER — Other Ambulatory Visit: Payer: Self-pay

## 2019-04-30 DIAGNOSIS — R05 Cough: Secondary | ICD-10-CM

## 2019-04-30 DIAGNOSIS — E1165 Type 2 diabetes mellitus with hyperglycemia: Secondary | ICD-10-CM

## 2019-04-30 DIAGNOSIS — R053 Chronic cough: Secondary | ICD-10-CM

## 2019-04-30 DIAGNOSIS — E785 Hyperlipidemia, unspecified: Secondary | ICD-10-CM

## 2019-04-30 DIAGNOSIS — Z130 Encounter for screening for diseases of the blood and blood-forming organs and certain disorders involving the immune mechanism: Secondary | ICD-10-CM

## 2019-04-30 NOTE — Progress Notes (Signed)
Virtual Visit via Telephone Note Due to national recommendations of social distancing due to [place] 19, telehealth visit is felt to be most appropriate for this patient at this time.  I discussed the limitations, risks, security and privacy concerns of performing an evaluation and management service by telephone and the availability of in person appointments. I also discussed with the patient that there may be a patient responsible charge related to this service. The patient expressed understanding and agreed to proceed.    I connected with Benjamin Mccarthy on 04/30/19  at   1:50 PM EST  EDT by telephone and verified that I am speaking with the correct person using two identifiers.   Consent I discussed the limitations, risks, security and privacy concerns of performing an evaluation and management service by telephone and the availability of in person appointments. I also discussed with the patient that there may be a patient responsible charge related to this service. The patient expressed understanding and agreed to proceed.   Location of Patient: Private  Residence   Location of Provider: [place] and [employer] Office    Persons participating in Telemedicine visit: Geryl Rankins FNP-BC [place] ID# [id number]   History of Present Illness: Telemedicine visit for: Establish Care  DM TYPE 2 He never started the metformin 1000 mg BID that was prescribed for him by his previous PCP a few years ago.  Lab Results  Component Value Date   HGBA1C 6.8 (A) 10/03/2017   Cough Chronic Dry cough which is intermittent and occurs daily.  He was given a medication in [place] for his cough called Ampodek and states it has helped.     History reviewed. No pertinent past medical history.  Past Surgical History:  Procedure Laterality Date  . NO PAST SURGERIES      Family History  Problem Relation Age of Onset  . Diabetes Neg Hx     Social History   Socioeconomic  History  . Marital status: Married    Spouse name: Not on file  . Number of children: Not on file  . Years of education: Not on file  . Highest education level: Not on file  Occupational History  . Not on file  Tobacco Use  . Smoking status: Never Smoker  . Smokeless tobacco: Never Used  Substance and Sexual Activity  . Alcohol use: No  . Drug use: No  . Sexual activity: Yes  Other Topics Concern  . Not on file  Social History Narrative  . Not on file   Social Determinants of Health   Financial Resource Strain:   . Difficulty of Paying Living Expenses: Not on file  Food Insecurity:   . Worried About Charity fundraiser in the Last Year: Not on file  . Ran Out of Food in the Last Year: Not on file  Transportation Needs:   . Lack of Transportation (Medical): Not on file  . Lack of Transportation (Non-Medical): Not on file  Physical Activity:   . Days of Exercise per Week: Not on file  . Minutes of Exercise per Session: Not on file  Stress:   . Feeling of Stress : Not on file  Social Connections:   . Frequency of Communication with Friends and Family: Not on file  . Frequency of Social Gatherings with Friends and Family: Not on file  . Attends Religious Services: Not on file  . Active Member of Clubs or Organizations: Not on file  .  Attends Archivist Meetings: Not on file  . Marital Status: Not on file     Observations/Objective: Awake, alert and oriented x 3   Review of Systems  Constitutional: Negative for fever, malaise/fatigue and weight loss.       Hypersomnia  HENT: Negative.  Negative for nosebleeds.   Eyes: Negative.  Negative for blurred vision, double vision and photophobia.  Respiratory: Positive for cough. Negative for sputum production, shortness of breath and wheezing.   Cardiovascular: Negative.  Negative for chest pain, palpitations and leg swelling.  Gastrointestinal: Negative.  Negative for heartburn, nausea and vomiting.   Musculoskeletal: Negative.  Negative for myalgias.  Neurological: Negative.  Negative for dizziness, focal weakness, seizures and headaches.  Psychiatric/Behavioral: Negative.  Negative for suicidal ideas.    Assessment and Plan: Benjamin Mccarthy was seen today for establish care.  Diagnoses and all orders for this visit:  Type 2 diabetes mellitus with hyperglycemia, without long-term current use of insulin (HCC) -     Hemoglobin A1c -     CMP14+EGFR -     TSH  Chronic cough -     QuantiFERON-TB Gold Plus  Dyslipidemia, goal LDL below 70 -     Lipid panel  Screening for deficiency anemia -     CBC     Follow Up Instructions Return in about 6 weeks (around 06/11/2019).     I discussed the assessment and treatment plan with the patient. The patient was provided an opportunity to ask questions and all were answered. The patient agreed with the plan and demonstrated an understanding of the instructions.   The patient was advised to call back or seek an in-person evaluation if the symptoms worsen or if the condition fails to improve as anticipated.  I provided 17 minutes of non-face-to-face time during this encounter including median intraservice time, reviewing previous notes, labs, imaging, medications and explaining diagnosis and management.  Gildardo Pounds, FNP-BC

## 2019-05-01 LAB — CMP14+EGFR
ALT: 47 IU/L — ABNORMAL HIGH (ref 0–44)
AST: 34 IU/L (ref 0–40)
Albumin/Globulin Ratio: 1.5 (ref 1.2–2.2)
Albumin: 4.6 g/dL (ref 4.0–5.0)
Alkaline Phosphatase: 159 IU/L — ABNORMAL HIGH (ref 39–117)
BUN/Creatinine Ratio: 12 (ref 9–20)
BUN: 11 mg/dL (ref 6–24)
Bilirubin Total: 0.7 mg/dL (ref 0.0–1.2)
CO2: 25 mmol/L (ref 20–29)
Calcium: 9.4 mg/dL (ref 8.7–10.2)
Chloride: 102 mmol/L (ref 96–106)
Creatinine, Ser: 0.89 mg/dL (ref 0.76–1.27)
GFR calc Af Amer: 117 mL/min/{1.73_m2} (ref >59)
GFR calc non Af Amer: 101 mL/min/{1.73_m2} (ref >59)
Globulin, Total: 3.1 g/dL (ref 1.5–4.5)
Glucose: 100 mg/dL — ABNORMAL HIGH (ref 65–99)
Potassium: 4.3 mmol/L (ref 3.5–5.2)
Sodium: 138 mmol/L (ref 134–144)
Total Protein: 7.7 g/dL (ref 6.0–8.5)

## 2019-05-01 LAB — CBC
Hematocrit: 43.7 % (ref 37.5–51.0)
Hemoglobin: 14.7 g/dL (ref 13.0–17.7)
MCH: 29.3 pg (ref 26.6–33.0)
MCHC: 33.6 g/dL (ref 31.5–35.7)
MCV: 87 fL (ref 79–97)
Platelets: 302 10*3/uL (ref 150–450)
RBC: 5.02 x10E6/uL (ref 4.14–5.80)
RDW: 13.9 % (ref 11.6–15.4)
WBC: 9.8 10*3/uL (ref 3.4–10.8)

## 2019-05-01 LAB — LIPID PANEL
Chol/HDL Ratio: 4.4 ratio (ref 0.0–5.0)
Cholesterol, Total: 174 mg/dL (ref 100–199)
HDL: 40 mg/dL (ref >39)
LDL Chol Calc (NIH): 93 mg/dL (ref 0–99)
Triglycerides: 243 mg/dL — ABNORMAL HIGH (ref 0–149)
VLDL Cholesterol Cal: 41 mg/dL — ABNORMAL HIGH (ref 5–40)

## 2019-05-01 LAB — HEMOGLOBIN A1C
Est. average glucose Bld gHb Est-mCnc: 143 mg/dL
Hgb A1c MFr Bld: 6.6 % — ABNORMAL HIGH (ref 4.8–5.6)

## 2019-05-01 LAB — TSH: TSH: 2.33 u[IU]/mL (ref 0.450–4.500)

## 2019-05-07 ENCOUNTER — Other Ambulatory Visit: Payer: Self-pay | Admitting: Nurse Practitioner

## 2019-05-07 MED ORDER — TRUE METRIX METER W/DEVICE KIT: PACK | 0 refills | Status: DC

## 2019-05-07 MED ORDER — TRUEPLUS LANCETS 28G MISC: 3 refills | Status: DC

## 2019-05-07 MED ORDER — TRUE METRIX BLOOD GLUCOSE TEST VI STRP: ORAL_STRIP | 12 refills | Status: DC

## 2019-05-07 MED ORDER — ATORVASTATIN CALCIUM 20 MG PO TABS: 20.0000 mg | ORAL_TABLET | Freq: Every day | ORAL | 3 refills | Status: DC

## 2019-05-08 MED FILL — !TRUE METRIX BLOOD GLUCOSE: 30 days supply | Qty: 1 | Fill #0

## 2019-05-08 MED FILL — ATORVASTATIN CALCIUM 20 MG: 20 | 30 days supply | Qty: 30 | Fill #0

## 2019-05-08 MED FILL — TRUEplus LANCETS 28G MISC: 50 days supply | Qty: 100 | Fill #0

## 2019-05-08 MED FILL — TRUE METRIX TEST STRIP: 25 days supply | Qty: 50 | Fill #0

## 2019-05-09 NOTE — Addendum Note (Signed)
Addended byVidal Schwalbe on: 05/09/2019 12:19 PM   Modules accepted: Orders

## 2019-05-30 ENCOUNTER — Other Ambulatory Visit: Payer: Self-pay

## 2019-05-30 ENCOUNTER — Ambulatory Visit: Payer: Self-pay | Attending: Nurse Practitioner

## 2019-05-30 DIAGNOSIS — R053 Chronic cough: Secondary | ICD-10-CM

## 2019-05-30 DIAGNOSIS — R05 Cough: Secondary | ICD-10-CM

## 2019-06-02 LAB — QUANTIFERON-TB GOLD PLUS
QuantiFERON Mitogen Value: >10 IU/mL
QuantiFERON Nil Value: 0.15 IU/mL
QuantiFERON TB1 Ag Value: 1.85 IU/mL
QuantiFERON TB2 Ag Value: 1.43 IU/mL
QuantiFERON-TB Gold Plus: POSITIVE — AB

## 2019-06-04 ENCOUNTER — Other Ambulatory Visit: Payer: Self-pay | Admitting: Nurse Practitioner

## 2019-06-04 DIAGNOSIS — R7612 Nonspecific reaction to cell mediated immunity measurement of gamma interferon antigen response without active tuberculosis: Secondary | ICD-10-CM

## 2019-06-05 NOTE — Progress Notes (Signed)
C[facility]ed the [facility]

## 2019-06-11 ENCOUNTER — Ambulatory Visit: Payer: Self-pay | Attending: Nurse Practitioner | Admitting: Nurse Practitioner

## 2019-06-11 ENCOUNTER — Other Ambulatory Visit: Payer: Self-pay

## 2019-06-20 ENCOUNTER — Ambulatory Visit: Payer: Self-pay

## 2019-06-25 ENCOUNTER — Ambulatory Visit
Admission: RE | Admit: 2019-06-25 | Discharge: 2019-06-25 | Disposition: A | Discharge status: Home / Self Care | Payer: No Typology Code available for payment source | Source: Ambulatory Visit | Attending: Nurse Practitioner | Admitting: Nurse Practitioner

## 2019-06-25 ENCOUNTER — Other Ambulatory Visit: Payer: Self-pay

## 2019-06-25 DIAGNOSIS — R7612 Nonspecific reaction to cell mediated immunity measurement of gamma interferon antigen response without active tuberculosis: Secondary | ICD-10-CM

## 2019-07-10 ENCOUNTER — Ambulatory Visit (INDEPENDENT_AMBULATORY_CARE_PROVIDER_SITE_OTHER): Payer: No Typology Code available for payment source | Admitting: Infectious Diseases

## 2019-07-10 ENCOUNTER — Emergency Department (HOSPITAL_COMMUNITY)
Admission: EM | Admit: 2019-07-10 | Discharge: 2019-07-11 | Discharge status: Against Medical Advice | Payer: No Typology Code available for payment source | Attending: Emergency Medicine | Admitting: Emergency Medicine

## 2019-07-10 ENCOUNTER — Other Ambulatory Visit: Payer: Self-pay

## 2019-07-10 ENCOUNTER — Encounter: Payer: Self-pay | Admitting: Infectious Diseases

## 2019-07-10 ENCOUNTER — Encounter (HOSPITAL_COMMUNITY): Payer: Self-pay

## 2019-07-10 DIAGNOSIS — S61315A Laceration without foreign body of left ring finger with damage to nail, initial encounter: Secondary | ICD-10-CM | POA: Insufficient documentation

## 2019-07-10 DIAGNOSIS — Y999 Unspecified external cause status: Secondary | ICD-10-CM | POA: Insufficient documentation

## 2019-07-10 DIAGNOSIS — Y929 Unspecified place or not applicable: Secondary | ICD-10-CM | POA: Insufficient documentation

## 2019-07-10 DIAGNOSIS — W28XXXA Contact with powered lawn mower, initial encounter: Secondary | ICD-10-CM | POA: Insufficient documentation

## 2019-07-10 DIAGNOSIS — Y939 Activity, unspecified: Secondary | ICD-10-CM | POA: Insufficient documentation

## 2019-07-10 DIAGNOSIS — S62639B Displaced fracture of distal phalanx of unspecified finger, initial encounter for open fracture: Secondary | ICD-10-CM

## 2019-07-10 DIAGNOSIS — S61313A Laceration without foreign body of left middle finger with damage to nail, initial encounter: Secondary | ICD-10-CM | POA: Insufficient documentation

## 2019-07-10 DIAGNOSIS — Z227 Latent tuberculosis: Secondary | ICD-10-CM

## 2019-07-10 DIAGNOSIS — S62635A Displaced fracture of distal phalanx of left ring finger, initial encounter for closed fracture: Secondary | ICD-10-CM | POA: Insufficient documentation

## 2019-07-10 DIAGNOSIS — Z23 Encounter for immunization: Secondary | ICD-10-CM | POA: Insufficient documentation

## 2019-07-10 HISTORY — DX: Latent tuberculosis: Z22.7

## 2019-07-10 MED ORDER — RIFAMPIN 300 MG PO CAPS: 600.0000 mg | ORAL_CAPSULE | Freq: Every day | ORAL | 3 refills | Status: AC

## 2019-07-10 MED ORDER — OXYCODONE-ACETAMINOPHEN 5-325 MG PO TABS
1.0000 | ORAL_TABLET | Freq: Once | ORAL | Status: AC
  Administered 2019-07-10: 1 via ORAL
  Filled 2019-07-10: qty 1

## 2019-07-10 NOTE — Assessment & Plan Note (Addendum)
Will give him 4 months of rifampin He is not on any other rx's.  Explained that rx is metabolized through his liver and to call me if  He has change in eye color or abd pain.  Explained that change in color (red) of urine, tears and saliva is expected.  Has gotten COVID vax.  multiple questions answered. Asked to avoid [place] Wort.  Will see him backin 1 month.

## 2019-07-10 NOTE — ED Notes (Signed)
Pt called for a room, no answer. Pt not seen in the lobby

## 2019-07-10 NOTE — ED Triage Notes (Signed)
Pt arrives to ED w/ c/o lac to L hand (middle and ring finger. Bleeding controlled in triage. Pt reports 10/10 pain.

## 2019-07-10 NOTE — Progress Notes (Signed)
   Subjective:    Patient ID: Benjamin Mccarthy, male    DOB: [birth year], 49 y.o.   MRN: [id number]  HPI 49 yo M with hx of DM2 (? Pt denies this, his med list has metformin. He never took this). And recent quantiferon gold + 04-30-19.He had a dry cough at that time, it took 1-2 months for cough to resolve. He did not receive anbx at that time. He did take vitamins. His cough has since resolved.  He has otherwise been fine- no cough, SOB, no fever or chills.   He did take metformin briefly but gave him dizziness, frequency.  Born in [place]- 2002. He did not have previous TB screening.  His grandfather had TB. Last exposure 1995  The past medical history, family history and social history were reviewed/updated in [facility]   Review of Systems  Constitutional: Negative for appetite change, chills, fever and unexpected weight change.  Eyes: Negative for visual disturbance.  Respiratory: Negative for cough and shortness of breath.   Gastrointestinal: Negative for constipation and diarrhea.  Genitourinary: Negative for difficulty urinating.  Neurological: Negative for headaches.  Hematological: Negative for adenopathy.       Objective:   Physical Exam Vitals reviewed.  Constitutional:      Appearance: Normal appearance. He is obese.  HENT:     Mouth/Throat:     Mouth: Mucous membranes are moist.     Pharynx: No oropharyngeal exudate.  Eyes:     Extraocular Movements: Extraocular movements intact.     Pupils: Pupils are equal, round, and reactive to light.  Cardiovascular:     Rate and Rhythm: Normal rate and regular rhythm.  Pulmonary:     Effort: Pulmonary effort is normal.     Breath sounds: Normal breath sounds.  Abdominal:     General: Bowel sounds are normal. There is no distension.     Palpations: Abdomen is soft.     Tenderness: There is no abdominal tenderness.  Musculoskeletal:     Cervical back: Normal range of motion.     Right lower leg: No edema.     Left  lower leg: No edema.  Lymphadenopathy:     Cervical: No cervical adenopathy.     Upper Body:     Right upper body: No axillary adenopathy.     Left upper body: No axillary adenopathy.  Neurological:     General: No focal deficit present.     Mental Status: He is alert.  Psychiatric:        Mood and Affect: Mood normal.           Assessment & Plan:

## 2019-07-11 ENCOUNTER — Emergency Department (HOSPITAL_COMMUNITY): Payer: No Typology Code available for payment source

## 2019-07-11 MED ORDER — IBUPROFEN 600 MG PO TABS
600.0000 mg | ORAL_TABLET | Freq: Four times a day (QID) | ORAL | 0 refills | Status: DC | PRN
Start: 2019-07-11 — End: 2020-03-14

## 2019-07-11 MED ORDER — LIDOCAINE HCL 2 % IJ SOLN
20.0000 mL | Freq: Once | INTRAMUSCULAR | Status: AC
  Administered 2019-07-11: 400 mg via INTRADERMAL
  Filled 2019-07-11: qty 20

## 2019-07-11 MED ORDER — CEPHALEXIN 500 MG PO CAPS
500.0000 mg | ORAL_CAPSULE | Freq: Four times a day (QID) | ORAL | 0 refills | Status: DC
Start: 2019-07-11 — End: 2020-03-14

## 2019-07-11 MED ORDER — IBUPROFEN 800 MG PO TABS
800.0000 mg | ORAL_TABLET | Freq: Once | ORAL | Status: AC
  Administered 2019-07-11: 800 mg via ORAL
  Filled 2019-07-11: qty 1

## 2019-07-11 MED ORDER — CEFAZOLIN SODIUM-DEXTROSE 2-4 GM/100ML-% IV SOLN
2.0000 g | Freq: Once | INTRAVENOUS | Status: AC
  Administered 2019-07-11: 2 g via INTRAVENOUS
  Filled 2019-07-11: qty 100

## 2019-07-11 MED ORDER — TETANUS-DIPHTH-ACELL PERTUSSIS 5-2.5-18.5 LF-MCG/0.5 IM SUSP
0.5000 mL | Freq: Once | INTRAMUSCULAR | Status: AC
  Administered 2019-07-11: 0.5 mL via INTRAMUSCULAR
  Filled 2019-07-11: qty 0.5

## 2019-07-11 NOTE — ED Notes (Signed)
Used translator to go over discharge papers.

## 2019-07-11 NOTE — ED Provider Notes (Signed)
[facility] DEPART[facility] Provider Note   CSN: [id number] Arrival date & time: 07/10/19  1734     History Chief Complaint  Patient presents with  . Extremity Laceration    Benjamin Mccarthy is a 49 y.o. male.  The history is provided by the patient. The history is limited by a language barrier. No language interpreter was used.     49 year old Hispanic speaking male presenting for evaluation of hand injury.  Patient report he was working with his lawnmower yesterday when he accidentally injured his left nondominant hand on the blade of the mower while trying to fix it.  He report acute onset of pain to the tip of his fingers endorses laceration to his middle and ring finger.  Pain is moderate in severity.  He did try washing it and applied dressing.  Report he has been avoiding the ED for prolonged period of time.  At this time his pain has improved.  No report of numbness.  No other injury.  Unsure last tetanus status.  Pain nonradiating and have been persistent.  Past Medical History:  Diagnosis Date  . LTBI (latent tuberculosis infection) 07/10/2019    Patient Active Problem List   Diagnosis Date Noted  . TB lung, latent 07/10/2019  . BROKEN TOOTH 10/07/2008  . HSV 03/29/2007  . 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 NO PAST SURGERIES         Family History  Problem Relation Age of Onset  . Unexplained death Father   . Diabetes Neg Hx     Social History   Tobacco Use  . Smoking status: Never Smoker  . Smokeless tobacco: Never Used  Substance Use Topics  . Alcohol use: No  . Drug use: No    Home Medications Prior to Admission medications   Medication Sig Start Date End Date Taking? Authorizing Provider  atorvastatin (LIPITOR) 20 MG tablet Take 1 tablet (20 mg total) by mouth daily. Patient not taking: Reported on 07/10/2019 05/07/19   Gildardo Pounds, NP  Blood  Glucose Monitoring Suppl (TRUE METRIX METER) w/Device KIT Use as instructed. Check blood glucose level by fingerstick twice per day. Patient not taking: Reported on 07/10/2019 05/07/19   Gildardo Pounds, NP  glucose blood (TRUE METRIX BLOOD GLUCOSE TEST) test strip Use as instructed. Check blood glucose level by fingerstick twice per day. Patient not taking: Reported on 07/10/2019 05/07/19   Gildardo Pounds, NP  metFORMIN (GLUCOPHAGE) 1000 MG tablet Take 1 tablet (1,000 mg total) by mouth 2 (two) times daily with a meal. Patient not taking: Reported on 04/30/2019 10/03/17   Clent Demark, PA-C  rifampin (RIFADIN) 300 MG capsule Take 2 capsules (600 mg total) by mouth daily. 07/10/19 11/07/19  Campbell Riches, MD  TRUEplus Lancets 28G MISC Use as instructed. Check blood glucose level by fingerstick twice per day. Patient not taking: Reported on 07/10/2019 05/07/19   Gildardo Pounds, NP    Allergies    Patient has no known allergies.  Review of Systems   Review of Systems  Constitutional: Negative for fever.  Skin: Positive for wound.  Neurological: Negative for numbness.    Physical Exam Updated Vital Signs BP (!) 154/102 (BP Location: Right Arm)   Pulse 67   Temp 98 F (36.7 C) (Oral)   Resp 16   SpO2 99%   Physical Exam Vitals and nursing note reviewed.  Constitutional:  General: He is not in acute distress.    Appearance: He is well-developed.  HENT:     Head: Atraumatic.  Eyes:     Conjunctiva/sclera: Conjunctivae normal.  Musculoskeletal:        General: Signs of injury (L hand: deep laceration noted to distal tip of middle and ring finger with nail involvement.  no joint involvement.) present.     Cervical back: Neck supple.  Skin:    Findings: No rash.  Neurological:     Mental Status: He is alert.     ED Results / Procedures / Treatments   Labs (all labs ordered are listed, but only abnormal results are displayed) Labs Reviewed - No data to  display  EKG None  Radiology DG Hand Complete Left  Result Date: 07/11/2019 CLINICAL DATA:  Hand laceration. EXAM: LEFT HAND - COMPLETE 3+ VIEW COMPARISON:  None. FINDINGS: Fractures of the middle and ring finger tufts. There is a flexion deformity at the little finger PIP joint which appears chronic. No metallic foreign body at the level of fractures. A tiny metallic body is seen at the thumb pad. IMPRESSION: Middle and ring finger tuft fractures, open per the history. Electronically Signed   By: Monte Fantasia M.D.   On: 07/11/2019 07:48    Procedures .Marland KitchenLaceration Repair  Date/Time: 07/11/2019 8:46 AM Performed by: Domenic Moras, PA-C Authorized by: Domenic Moras, PA-C   Consent:    Consent obtained:  Verbal   Consent given by:  Patient   Risks discussed:  Infection, need for additional repair, pain, poor cosmetic result and poor wound healing   Alternatives discussed:  No treatment and delayed treatment Universal protocol:    Procedure explained and questions answered to patient or proxy's satisfaction: yes     Relevant documents present and verified: yes     Test results available and properly labeled: yes     Imaging studies available: yes     Required blood products, implants, devices, and special equipment available: yes     Site/side marked: yes     Immediately prior to procedure, a time out was called: yes     Patient identity confirmed:  Verbally with patient Anesthesia (see MAR for exact dosages):    Anesthesia method:  Local infiltration   Local anesthetic:  Lidocaine 2% w/o epi Laceration details:    Location:  Finger   Finger location:  L long finger   Length (cm):  2   Depth (mm):  7 Repair type:    Repair type:  Intermediate Pre-procedure details:    Preparation:  Patient was prepped and draped in usual sterile fashion and imaging obtained to evaluate for foreign bodies Exploration:    Hemostasis achieved with:  Direct pressure   Wound exploration: wound  explored through full range of motion     Wound extent: underlying fracture     Contaminated: no   Treatment:    Area cleansed with:  Betadine and saline   Amount of cleaning:  Standard   Irrigation solution:  Sterile saline   Irrigation method:  Pressure wash   Visualized foreign bodies/material removed: no   Skin repair:    Repair method:  Sutures   Suture size:  5-0   Suture material:  Prolene   Suture technique:  Simple interrupted   Number of sutures:  4 Approximation:    Approximation:  Loose Post-procedure details:    Dressing:  Non-adherent dressing and splint for protection   Patient tolerance of procedure:  Tolerated well, no immediate complications .Marland KitchenLaceration Repair  Date/Time: 07/11/2019 8:47 AM Performed by: Domenic Moras, PA-C Authorized by: Domenic Moras, PA-C   Consent:    Consent obtained:  Verbal   Consent given by:  Patient   Risks discussed:  Infection, need for additional repair, pain, poor cosmetic result and poor wound healing   Alternatives discussed:  No treatment and delayed treatment Universal protocol:    Procedure explained and questions answered to patient or proxy's satisfaction: yes     Relevant documents present and verified: yes     Test results available and properly labeled: yes     Imaging studies available: yes     Required blood products, implants, devices, and special equipment available: yes     Site/side marked: yes     Immediately prior to procedure, a time out was called: yes     Patient identity confirmed:  Verbally with patient Anesthesia (see MAR for exact dosages):    Anesthesia method:  Local infiltration   Local anesthetic:  Lidocaine 2% w/o epi Laceration details:    Location:  Finger   Finger location:  L ring finger   Length (cm):  2   Depth (mm):  6 Repair type:    Repair type:  Intermediate Pre-procedure details:    Preparation:  Patient was prepped and draped in usual sterile fashion and imaging obtained to  evaluate for foreign bodies Exploration:    Hemostasis achieved with:  Direct pressure   Wound exploration: wound explored through full range of motion     Wound extent: underlying fracture     Contaminated: no   Treatment:    Area cleansed with:  Betadine and saline   Amount of cleaning:  Standard   Irrigation solution:  Sterile saline   Irrigation method:  Pressure wash   Visualized foreign bodies/material removed: no   Skin repair:    Repair method:  Sutures   Suture size:  5-0   Suture material:  Prolene   Suture technique:  Simple interrupted   Number of sutures:  5 Approximation:    Approximation:  Loose Post-procedure details:    Dressing:  Splint for protection and non-adherent dressing   Patient tolerance of procedure:  Tolerated well, no immediate complications   (including critical care time)  Medications Ordered in ED Medications  oxyCODONE-acetaminophen (PERCOCET/ROXICET) 5-325 MG per tablet 1 tablet (1 tablet Oral Given 07/10/19 1812)    ED Course  I have reviewed the triage vital signs and the nursing notes.  Pertinent labs & imaging results that were available during my care of the patient were reviewed by me and considered in my medical decision making (see chart for details).    MDM Rules/Calculators/A&P                      BP 122/89   Pulse 65   Temp 98.7 F (37.1 C) (Oral)   Resp 17   SpO2 98%   Final Clinical Impression(s) / ED Diagnoses Final diagnoses:  Open fracture of tuft of distal phalanx of finger  Laceration of left middle finger without foreign body with damage to nail, initial encounter  Laceration of left ring finger without foreign body with damage to nail, initial encounter    Rx / DC Orders ED Discharge Orders         Ordered    cephALEXin (KEFLEX) 500 MG capsule  4 times daily     07/11/19 0913  ibuprofen (ADVIL) 600 MG tablet  Every 6 hours PRN     07/11/19 0913         7:06 AM Pt accidentally injured his non  dominant hand involving the tip of 3rd and 4th finger with nail involvement but no obvious joint involvement.  Will obtain xray, will perform wound care which may includes sutures.  9:18 AM Suture repair is performed by me.  Finger splint was placed by me.  Given antibiotic via the IV and will discharge home with pain medication and antibiotic.  Return in 7 days for suture removal.  Hand specialist referral due to evidence of tuft fractures on x-ray. Care discussed with Dr. Johnney Killian.   Domenic Moras, PA-C 07/11/19 1252    Charlesetta Shanks, MD 07/11/19 [phone]

## 2019-07-11 NOTE — Discharge Instructions (Signed)
You have been diagnosed with injury to your fingers.  You have broken bones in the fingers.  Please call and follow up with hand specialist for further care.  Your sutures will need to be remove in 7 days.  Take antibiotic as prescribed. Return if you notice any sign of infection.

## 2019-07-11 NOTE — ED Provider Notes (Signed)
Medical screening examination/treatment/procedure(s) were conducted as a shared visit with non-physician practitioner(s) and myself.  I personally evaluated the patient during the encounter.    Patient was trying to fix a lawnmower.  The blade suddenly engaged and cut his third and fourth fingers of the left hand.  Patient has lacerations of the distal tips of the third and fourth digits on the left hand with avulsion of the distal tip of the nail.  There is no active bleeding at this time.  Imaging shows tuft fractures.  I agree with administering Ancef, cleaning and repair with referral to hand surgery for follow-up.   Arby Barrette, MD 07/11/19 [phone]

## 2019-07-12 ENCOUNTER — Telehealth: Payer: Self-pay | Admitting: Nurse Practitioner

## 2019-07-12 NOTE — Telephone Encounter (Signed)
Will route to PCP 

## 2019-07-12 NOTE — Telephone Encounter (Signed)
Patient's wife  Call because her husband had an injury yesterday he went to Emergency room  And the ED refer him to [facility] and they don't accept uninsured patients .  Please place a new referral for Orthopedics  For finger laceration  Thank you .

## 2019-07-14 ENCOUNTER — Other Ambulatory Visit: Payer: Self-pay | Admitting: Nurse Practitioner

## 2019-07-14 DIAGNOSIS — T148XXA Other injury of unspecified body region, initial encounter: Secondary | ICD-10-CM

## 2019-07-18 ENCOUNTER — Ambulatory Visit (INDEPENDENT_AMBULATORY_CARE_PROVIDER_SITE_OTHER): Payer: Self-pay | Admitting: Surgery

## 2019-07-18 ENCOUNTER — Other Ambulatory Visit: Payer: Self-pay

## 2019-07-18 ENCOUNTER — Encounter: Payer: Self-pay | Admitting: Surgery

## 2019-07-18 DIAGNOSIS — M79642 Pain in left hand: Secondary | ICD-10-CM

## 2019-07-19 DIAGNOSIS — S61219A Laceration without foreign body of unspecified finger without damage to nail, initial encounter: Secondary | ICD-10-CM | POA: Insufficient documentation

## 2019-08-07 ENCOUNTER — Ambulatory Visit: Payer: No Typology Code available for payment source | Admitting: Infectious Diseases

## 2020-01-02 ENCOUNTER — Other Ambulatory Visit: Payer: Self-pay

## 2020-01-02 ENCOUNTER — Ambulatory Visit: Payer: Self-pay | Admitting: Nurse Practitioner

## 2020-01-10 ENCOUNTER — Ambulatory Visit: Payer: No Typology Code available for payment source | Admitting: Infectious Diseases

## 2020-01-22 ENCOUNTER — Other Ambulatory Visit: Payer: Self-pay

## 2020-01-22 ENCOUNTER — Encounter: Payer: Self-pay | Admitting: Infectious Diseases

## 2020-01-22 ENCOUNTER — Ambulatory Visit (INDEPENDENT_AMBULATORY_CARE_PROVIDER_SITE_OTHER): Payer: No Typology Code available for payment source | Admitting: Infectious Diseases

## 2020-01-22 VITALS — BP 126/85 | HR 69 | Temp 98.0°F | Wt 196.0 lb

## 2020-01-22 DIAGNOSIS — Z227 Latent tuberculosis: Secondary | ICD-10-CM

## 2020-01-22 DIAGNOSIS — Z23 Encounter for immunization: Secondary | ICD-10-CM

## 2020-01-22 NOTE — Progress Notes (Signed)
   Subjective:    Patient ID: Benjamin Mccarthy, male    DOB: 1970-09-05, 49 y.o.   MRN: [id number]  HPI 49 yo M with hx of DM2 (? Pt denies this, his med list has metformin. He never took this). And recent quantiferon gold + 04-30-19. He did take metformin briefly but gave him dizziness, frequency.  Born in [place]- 2002. He did not have previous TB screening.  His grandfather had TB. Last exposure 1995. He had CXR at ID visit [id number]. He was started on rifampin for 4 months.  He and his wife (and interpreter) explain that he is close to completing the rx now. There was difficulty getting his rx initially.  He has taken the meds for 6 months now.  He has had red urine.   Review of Systems  Constitutional: Negative for appetite change, fever and unexpected weight change.  Eyes: Negative for discharge and redness.  Respiratory: Negative for shortness of breath.   Gastrointestinal: Negative for constipation.  Genitourinary: Negative for difficulty urinating.       Objective:   Physical Exam Vitals reviewed.  Constitutional:      Appearance: Normal appearance.  HENT:     Mouth/Throat:     Mouth: Mucous membranes are moist.     Pharynx: No oropharyngeal exudate.  Eyes:     Extraocular Movements: Extraocular movements intact.     Pupils: Pupils are equal, round, and reactive to light.  Cardiovascular:     Rate and Rhythm: Normal rate and regular rhythm.  Pulmonary:     Effort: Pulmonary effort is normal.     Breath sounds: Normal breath sounds.  Abdominal:     General: Bowel sounds are normal. There is no distension.     Palpations: Abdomen is soft.     Tenderness: There is no abdominal tenderness.  Musculoskeletal:        General: Normal range of motion.     Cervical back: Normal range of motion and neck supple.     Right lower leg: No edema.     Left lower leg: No edema.  Neurological:     General: No focal deficit present.     Mental Status: He is alert.            Assessment & Plan:

## 2020-01-22 NOTE — Assessment & Plan Note (Signed)
(  seen with interpreter) Pt completed 6 months of LTBI rx when he should have gotten 3.  He is doing well. I encouraged him to stop his rx No further TB skin or blood tests. If screening needed in future, CXR. Explained to pt.  Flu shot today.  rtc prn

## 2020-01-29 ENCOUNTER — Telehealth: Payer: Self-pay | Admitting: Nurse Practitioner

## 2020-01-29 NOTE — Telephone Encounter (Signed)
I call the Pt for financial appt, wife answer the call,  she said that the specialist told them that the Pt is  clear and he can have an appt with Zelda, please follow up and schedule a new appt with the PCP that way he can schedule after that for financial, thanks

## 2020-01-29 NOTE — Telephone Encounter (Signed)
Patient have an appt. PCP on 03/14/2020.

## 2020-02-01 ENCOUNTER — Ambulatory Visit: Payer: No Typology Code available for payment source

## 2020-03-14 ENCOUNTER — Ambulatory Visit: Payer: Self-pay | Attending: Nurse Practitioner | Admitting: Nurse Practitioner

## 2020-03-14 ENCOUNTER — Ambulatory Visit: Payer: Self-pay

## 2020-03-14 ENCOUNTER — Other Ambulatory Visit: Payer: Self-pay

## 2020-03-14 ENCOUNTER — Encounter: Payer: Self-pay | Admitting: Nurse Practitioner

## 2020-03-14 VITALS — BP 128/75 | HR 67 | Temp 98.4°F | Ht 63.0 in | Wt 193.0 lb

## 2020-03-14 DIAGNOSIS — E1165 Type 2 diabetes mellitus with hyperglycemia: Secondary | ICD-10-CM

## 2020-03-14 DIAGNOSIS — E785 Hyperlipidemia, unspecified: Secondary | ICD-10-CM

## 2020-03-14 DIAGNOSIS — Z0000 Encounter for general adult medical examination without abnormal findings: Secondary | ICD-10-CM

## 2020-03-14 DIAGNOSIS — Z1211 Encounter for screening for malignant neoplasm of colon: Secondary | ICD-10-CM

## 2020-03-14 HISTORY — DX: Type 2 diabetes mellitus with hyperglycemia: E11.65

## 2020-03-14 LAB — POCT GLYCOSYLATED HEMOGLOBIN (HGB A1C): HbA1c, POC (prediabetic range): 6.4 % (ref 5.7–6.4)

## 2020-03-14 LAB — GLUCOSE, POCT (MANUAL RESULT ENTRY): POC Glucose: 167 mg/dl — AB (ref 70–99)

## 2020-03-14 MED ORDER — METFORMIN HCL 500 MG PO TABS: 500.0000 mg | ORAL_TABLET | Freq: Every day | ORAL | 3 refills | Status: DC

## 2020-03-14 MED ORDER — NYSTATIN 100000 UNIT/GM EX CREA: 1.0000 "application " | TOPICAL_CREAM | Freq: Two times a day (BID) | CUTANEOUS | 1 refills | Status: DC

## 2020-03-14 MED ORDER — ATORVASTATIN CALCIUM 20 MG PO TABS: 20.0000 mg | ORAL_TABLET | Freq: Every day | ORAL | 3 refills | Status: DC

## 2020-03-14 MED ORDER — METFORMIN HCL 500 MG PO TABS: 500.0000 mg | ORAL_TABLET | Freq: Two times a day (BID) | ORAL | 1 refills | Status: DC

## 2020-03-14 NOTE — Progress Notes (Addendum)
Assessment & Plan:  Benjamin Mccarthy was seen today for annual exam.  Diagnoses and all orders for this visit:  Encounter for annual physical exam  Type 2 diabetes mellitus with hyperglycemia, unspecified whether long term insulin use (HCC) -     Glucose (CBG) -     HgB A1c -     Microalbumin/Creatinine Ratio, Urine -     metFORMIN (GLUCOPHAGE) 500 MG tablet; Take 1 tablet (500 mg total) by mouth daily with breakfast. -     CMP14+EGFR Continue blood sugar control as discussed in office today, low carbohydrate diet, and regular physical exercise as tolerated, 150 minutes per week (30 min each day, 5 days per week, or 50 min 3 days per week). Keep blood sugar logs with fasting goal of 90-130 mg/dl, post prandial (after you eat) less than 180.  For Hypoglycemia: BS <60 and Hyperglycemia BS >400; contact the clinic ASAP. Annual eye exams and foot exams are recommended.   Colon cancer screening -     Fecal occult blood, imunochemical([facility])  Dyslipidemia, goal LDL below 70 -     Lipid panel -     atorvastatin (LIPITOR) 20 MG tablet; Take 1 tablet (20 mg total) by mouth daily. INSTRUCTIONS: Work on a low fat, heart healthy diet and participate in regular aerobic exercise program by working out at least 150 minutes per week; 5 days a week-30 minutes per day. Avoid red meat/beef/steak,  fried foods. junk foods, sodas, sugary drinks, unhealthy snacking, alcohol and smoking.  Drink at least 80 oz of water per day and monitor your carbohydrate intake daily.      Patient has been counseled on age-appropriate routine health concerns for screening and prevention. These are reviewed and up-to-date. Referrals have been placed accordingly. Immunizations are up-to-date or declined.    Subjective:   Chief Complaint  Patient presents with   Annual Exam    Pt. Is here for a physical.    HPI Benjamin Mccarthy 50 y.o. male presents to office today for physical.  VRI was used to  communicate directly with patient for the entire encounter including providing detailed patient instructions.   He has no issues or concerns today. Currently not taking any medications. Metformin decreased to 500 mg daily and lipitor refilled.  Denies chest pain, shortness of breath, palpitations, lightheadedness, dizziness, headaches or BLE edema.   The 10-year ASCVD risk score Mikey Bussing DC Brooke Bonito., et al., 2013) is: 6.2%   Values used to calculate the score:     Age: 41 years     Sex: Male     Is Non-Hispanic African American: No     Diabetic: Yes     Tobacco smoker: No     Systolic Blood Pressure: 427 mmHg     Is BP treated: No     HDL Cholesterol: 40 mg/dL     Total Cholesterol: 174 mg/dL   BP Readings from Last 3 Encounters:  03/14/20 128/75  01/22/20 126/85  07/11/19 122/89   Lab Results  Component Value Date   HGBA1C 6.4 03/14/2020   Lab Results  Component Value Date   LDLCALC 93 04/30/2019     Review of Systems  Constitutional: Negative for fever, malaise/fatigue and weight loss.  HENT: Negative.  Negative for nosebleeds.   Eyes: Negative.  Negative for blurred vision, double vision and photophobia.  Respiratory: Negative.  Negative for cough and shortness of breath.   Cardiovascular: Negative.  Negative for chest pain, palpitations and leg swelling.  Gastrointestinal: Negative.  Negative for heartburn, nausea and vomiting.  Genitourinary: Negative.   Musculoskeletal: Negative.  Negative for myalgias.  Skin: Negative.   Neurological: Negative.  Negative for dizziness, focal weakness, seizures and headaches.  Endo/Heme/Allergies: Negative.   Psychiatric/Behavioral: Negative.  Negative for suicidal ideas.    Past Medical History:  Diagnosis Date   LTBI (latent tuberculosis infection) 07/10/2019   Type 2 diabetes mellitus with hyperglycemia, without long-term current use of insulin ([place]) 03/14/2020    Past Surgical History:  Procedure Laterality Date   NO PAST  SURGERIES      Family History  Problem Relation Age of Onset   Unexplained death Father    Diabetes Neg Hx     Social History Reviewed with no changes to be made today.   Outpatient Medications Prior to Visit  Medication Sig Dispense Refill   atorvastatin (LIPITOR) 20 MG tablet Take 1 tablet (20 mg total) by mouth daily. (Patient not taking: No sig reported) 90 tablet 3   Blood Glucose Monitoring Suppl (TRUE METRIX METER) w/Device KIT Use as instructed. Check blood glucose level by fingerstick twice per day. (Patient not taking: No sig reported) 1 kit 0   cephALEXin (KEFLEX) 500 MG capsule Take 1 capsule (500 mg total) by mouth 4 (four) times daily. (Patient not taking: No sig reported) 40 capsule 0   glucose blood (TRUE METRIX BLOOD GLUCOSE TEST) test strip Use as instructed. Check blood glucose level by fingerstick twice per day. (Patient not taking: No sig reported) 100 each 12   ibuprofen (ADVIL) 600 MG tablet Take 1 tablet (600 mg total) by mouth every 6 (six) hours as needed. (Patient not taking: No sig reported) 30 tablet 0   metFORMIN (GLUCOPHAGE) 1000 MG tablet Take 1 tablet (1,000 mg total) by mouth 2 (two) times daily with a meal. (Patient not taking: No sig reported) 180 tablet 3   TRUEplus Lancets 28G MISC Use as instructed. Check blood glucose level by fingerstick twice per day. (Patient not taking: No sig reported) 100 each 3   No facility-administered medications prior to visit.    No Known Allergies     Objective:    BP 128/75 (BP Location: Left Arm, Patient Position: Sitting, Cuff Size: Large)    Pulse 67    Temp 98.4 F (36.9 C) (Oral)    Ht $R'5\' 3"'rk$  (1.6 m)    Wt 193 lb (87.5 kg)    SpO2 100%    BMI 34.19 kg/m  Wt Readings from Last 3 Encounters:  03/14/20 193 lb (87.5 kg)  01/22/20 196 lb (88.9 kg)  07/10/19 194 lb (88 kg)    Physical Exam Constitutional:      Appearance: He is well-developed and well-nourished.  HENT:     Head: Normocephalic and  atraumatic.     Right Ear: Hearing, tympanic membrane, ear canal and external ear normal.     Left Ear: Hearing, tympanic membrane, ear canal and external ear normal.     Nose: Nose normal. No mucosal edema or rhinorrhea.     Mouth/Throat:     Mouth: Oropharynx is clear and moist and mucous membranes are normal. Mucous membranes are moist.     Dentition: Abnormal dentition. No gum lesions.     Tongue: No lesions.     Pharynx: Uvula midline.     Tonsils: No tonsillar exudate. 1+ on the right. 1+ on the left.  Eyes:     General: Lids are normal. No scleral icterus.  Extraocular Movements: EOM normal.     Conjunctiva/sclera: Conjunctivae normal.     Pupils: Pupils are equal, round, and reactive to light.  Neck:     Thyroid: No thyromegaly.     Trachea: No tracheal deviation.  Cardiovascular:     Rate and Rhythm: Normal rate and regular rhythm.     Pulses: Intact distal pulses.          Posterior tibial pulses are 2+ on the right side and 2+ on the left side.     Heart sounds: Normal heart sounds. No murmur heard. No friction rub. No gallop.   Pulmonary:     Effort: Pulmonary effort is normal. No respiratory distress.     Breath sounds: Normal breath sounds. No wheezing or rales.  Chest:     Chest wall: No mass or tenderness.  Breasts:     Right: No inverted nipple, mass, nipple discharge, skin change or tenderness.     Left: No inverted nipple, mass, nipple discharge, skin change or tenderness.    Abdominal:     General: Bowel sounds are normal. There is no distension.     Palpations: Abdomen is soft. There is no mass.     Tenderness: There is no abdominal tenderness. There is no guarding or rebound.     Hernia: There is no hernia in the right inguinal area or left inguinal area.  Genitourinary:    Testes:        Right: Mass, tenderness or swelling not present. Right testis is descended. Cremasteric reflex is present.         Left: Mass, tenderness or swelling not present.  Left testis is descended. Cremasteric reflex is present.   Musculoskeletal:        General: No tenderness, deformity or edema. Normal range of motion.     Cervical back: Normal range of motion and neck supple.  Feet:     Right foot:     Protective Sensation: 10 sites tested. 10 sites sensed.     Left foot:     Protective Sensation: 10 sites tested. 10 sites sensed.  Lymphadenopathy:     Cervical: No cervical adenopathy.     Lower Body: No right inguinal and no right inguinal adenopathy. No left inguinal and no left inguinal adenopathy.  Skin:    General: Skin is warm and dry.     Capillary Refill: Capillary refill takes less than 2 seconds.     Findings: No erythema.  Neurological:     Mental Status: He is alert and oriented to person, place, and time.     Cranial Nerves: No cranial nerve deficit.     Sensory: Sensation is intact.     Motor: Motor function is intact. No abnormal muscle tone.     Coordination: Coordination is intact. Coordination normal.     Gait: Gait is intact.     Deep Tendon Reflexes: Reflexes normal.     Reflex Scores:      Patellar reflexes are 2+ on the right side and 2+ on the left side. Psychiatric:        Attention and Perception: Attention and perception normal.        Mood and Affect: Mood and affect, mood and affect normal.        Speech: Speech normal.        Behavior: Behavior normal.        Thought Content: Thought content normal.        Cognition and Memory:  Cognition and memory normal.        Judgment: Judgment normal.          Patient has been counseled extensively about nutrition and exercise as well as the importance of adherence with medications and regular follow-up. The patient was given clear instructions to go to ER or return to medical center if symptoms don't improve, worsen or new problems develop. The patient verbalized understanding.   Follow-up: No follow-ups on file.   Gildardo Pounds, FNP-BC [facility]  and [facility] [place], [place]   03/14/2020, 3:32 PM

## 2020-03-15 LAB — CMP14+EGFR
ALT: 33 IU/L (ref 0–44)
AST: 30 IU/L (ref 0–40)
Albumin/Globulin Ratio: 1.2 (ref 1.2–2.2)
Albumin: 4.5 g/dL (ref 4.0–5.0)
Alkaline Phosphatase: 131 IU/L — ABNORMAL HIGH (ref 44–121)
BUN/Creatinine Ratio: 13 (ref 9–20)
BUN: 12 mg/dL (ref 6–24)
Bilirubin Total: 0.6 mg/dL (ref 0.0–1.2)
CO2: 24 mmol/L (ref 20–29)
Calcium: 9.7 mg/dL (ref 8.7–10.2)
Chloride: 99 mmol/L (ref 96–106)
Creatinine, Ser: 0.92 mg/dL (ref 0.76–1.27)
GFR calc Af Amer: 113 mL/min/{1.73_m2} (ref >59)
GFR calc non Af Amer: 97 mL/min/{1.73_m2} (ref >59)
Globulin, Total: 3.9 g/dL (ref 1.5–4.5)
Glucose: 129 mg/dL — ABNORMAL HIGH (ref 65–99)
Potassium: 4.2 mmol/L (ref 3.5–5.2)
Sodium: 137 mmol/L (ref 134–144)
Total Protein: 8.4 g/dL (ref 6.0–8.5)

## 2020-03-15 LAB — MICROALBUMIN / CREATININE URINE RATIO
Creatinine, Urine: 98.3 mg/dL
Microalb/Creat Ratio: 4 mg/g creat (ref 0–29)
Microalbumin, Urine: 3.5 ug/mL

## 2020-03-15 LAB — LIPID PANEL
Chol/HDL Ratio: 5.2 ratio — ABNORMAL HIGH (ref 0.0–5.0)
Cholesterol, Total: 199 mg/dL (ref 100–199)
HDL: 38 mg/dL — ABNORMAL LOW (ref >39)
LDL Chol Calc (NIH): 101 mg/dL — ABNORMAL HIGH (ref 0–99)
Triglycerides: 358 mg/dL — ABNORMAL HIGH (ref 0–149)
VLDL Cholesterol Cal: 60 mg/dL — ABNORMAL HIGH (ref 5–40)

## 2020-03-28 LAB — FECAL OCCULT BLOOD, IMMUNOCHEMICAL: Fecal Occult Bld: NEGATIVE

## 2020-06-10 ENCOUNTER — Ambulatory Visit: Payer: Self-pay | Attending: Nurse Practitioner | Admitting: Nurse Practitioner

## 2020-06-10 ENCOUNTER — Other Ambulatory Visit: Payer: Self-pay

## 2020-10-15 ENCOUNTER — Ambulatory Visit: Payer: No Typology Code available for payment source

## 2020-10-15 ENCOUNTER — Other Ambulatory Visit: Payer: Self-pay

## 2020-10-15 ENCOUNTER — Ambulatory Visit: Payer: Self-pay | Attending: Physician Assistant | Admitting: Physician Assistant

## 2020-10-29 ENCOUNTER — Ambulatory Visit: Payer: Self-pay | Attending: Physician Assistant | Admitting: Physician Assistant

## 2020-10-29 ENCOUNTER — Other Ambulatory Visit: Payer: Self-pay

## 2020-10-29 ENCOUNTER — Telehealth: Payer: Self-pay

## 2020-10-29 DIAGNOSIS — E1165 Type 2 diabetes mellitus with hyperglycemia: Secondary | ICD-10-CM

## 2020-10-29 DIAGNOSIS — E785 Hyperlipidemia, unspecified: Secondary | ICD-10-CM

## 2020-10-29 DIAGNOSIS — Z789 Other specified health status: Secondary | ICD-10-CM

## 2020-10-29 MED ORDER — METFORMIN HCL 500 MG PO TABS: 500.0000 mg | ORAL_TABLET | Freq: Every day | ORAL | 1 refills | Status: DC

## 2020-10-29 MED ORDER — ATORVASTATIN CALCIUM 20 MG PO TABS: 20.0000 mg | ORAL_TABLET | Freq: Every day | ORAL | 1 refills | Status: DC

## 2020-10-29 NOTE — Telephone Encounter (Signed)
Pt has been scheduled and reminder has been mailed.  

## 2020-10-29 NOTE — Progress Notes (Signed)
Virtual Visit via Telephone Note  I connected with Benjamin Mccarthy on 10/29/20 at  4:10 PM EDT by telephone and verified that I am speaking with the correct person using two identifiers.  Location: Patient: home Provider: [facility] office Dellia Nims with pacific interpreters   I discussed the limitations, risks, security and privacy concerns of performing an evaluation and management service by telephone and the availability of in person appointments. I also discussed with the patient that there may be a patient responsible charge related to this service. The patient expressed understanding and agreed to proceed.   History of Present Illness:  patient is calling for med RF.  Blood sugars running 90-150.  He is doing well.  Compliant with meds.  He is willing to come in for bloodwork.  Last A1C=6.4 in January/2022.  Needs appt with financial.     Observations/Objective:  NAD.  A&Ox3   Assessment and Plan: 1. Dyslipidemia, goal LDL below 70 - Hemoglobin O5F; Future - Lipid panel; Future - atorvastatin (LIPITOR) 20 MG tablet; Take 1 tablet (20 mg total) by mouth daily.  Dispense: 90 tablet; Refill: 1  2. Type 2 diabetes mellitus with hyperglycemia, unspecified whether long term insulin use (HCC) - Hemoglobin A1c; Future - Comprehensive metabolic panel; Future - metFORMIN (GLUCOPHAGE) 500 MG tablet; Take 1 tablet (500 mg total) by mouth daily with breakfast.  Dispense: 90 tablet; Refill: 1   3. Language barrier- Pacific interpreters used and additional time performing visit was required.    Follow Up Instructions: Msg sent to [facility] for financial appt. See PCP in 4-6 months   I discussed the assessment and treatment plan with the patient. The patient was provided an opportunity to ask questions and all were answered. The patient agreed with the plan and demonstrated an understanding of the instructions.   The patient was advised to call back or seek an in-person evaluation if  the symptoms worsen or if the condition fails to improve as anticipated.  I provided 12 minutes of non-face-to-face time during this encounter.   Georgian Co, PA-C  Patient ID: Benjamin Mccarthy, male   DOB: 1971/01/12, 50 y.o.   MRN: [id number]

## 2020-10-29 NOTE — Telephone Encounter (Signed)
-----   Message from Anders Simmonds, [place] sent at 10/29/2020  3:55 PM EDT ----- See PCP in 4-6 months

## 2020-11-05 ENCOUNTER — Encounter: Payer: Self-pay | Admitting: Nurse Practitioner

## 2020-11-05 ENCOUNTER — Other Ambulatory Visit: Payer: Self-pay

## 2020-11-05 ENCOUNTER — Ambulatory Visit: Payer: No Typology Code available for payment source | Attending: Nurse Practitioner

## 2020-11-05 DIAGNOSIS — E785 Hyperlipidemia, unspecified: Secondary | ICD-10-CM

## 2020-11-05 DIAGNOSIS — E1165 Type 2 diabetes mellitus with hyperglycemia: Secondary | ICD-10-CM

## 2020-11-06 LAB — LIPID PANEL
Chol/HDL Ratio: 5 ratio (ref 0.0–5.0)
Cholesterol, Total: 221 mg/dL — ABNORMAL HIGH (ref 100–199)
HDL: 44 mg/dL (ref >39)
LDL Chol Calc (NIH): 109 mg/dL — ABNORMAL HIGH (ref 0–99)
Triglycerides: 398 mg/dL — ABNORMAL HIGH (ref 0–149)
VLDL Cholesterol Cal: 68 mg/dL — ABNORMAL HIGH (ref 5–40)

## 2020-11-06 LAB — COMPREHENSIVE METABOLIC PANEL
ALT: 76 IU/L — ABNORMAL HIGH (ref 0–44)
AST: 43 IU/L — ABNORMAL HIGH (ref 0–40)
Albumin/Globulin Ratio: 1.3 (ref 1.2–2.2)
Albumin: 4.6 g/dL (ref 4.0–5.0)
Alkaline Phosphatase: 173 IU/L — ABNORMAL HIGH (ref 44–121)
BUN/Creatinine Ratio: 16 (ref 9–20)
BUN: 15 mg/dL (ref 6–24)
Bilirubin Total: 0.6 mg/dL (ref 0.0–1.2)
CO2: 25 mmol/L (ref 20–29)
Calcium: 9.8 mg/dL (ref 8.7–10.2)
Chloride: 100 mmol/L (ref 96–106)
Creatinine, Ser: 0.96 mg/dL (ref 0.76–1.27)
Globulin, Total: 3.5 g/dL (ref 1.5–4.5)
Glucose: 111 mg/dL — ABNORMAL HIGH (ref 65–99)
Potassium: 4 mmol/L (ref 3.5–5.2)
Sodium: 142 mmol/L (ref 134–144)
Total Protein: 8.1 g/dL (ref 6.0–8.5)
eGFR: 96 mL/min/{1.73_m2} (ref >59)

## 2020-11-06 LAB — HEMOGLOBIN A1C
Est. average glucose Bld gHb Est-mCnc: 143 mg/dL
Hgb A1c MFr Bld: 6.6 % — ABNORMAL HIGH (ref 4.8–5.6)

## 2021-03-04 ENCOUNTER — Ambulatory Visit: Payer: Self-pay | Attending: Nurse Practitioner | Admitting: Nurse Practitioner

## 2021-03-04 ENCOUNTER — Other Ambulatory Visit: Payer: Self-pay

## 2021-03-04 ENCOUNTER — Encounter: Payer: Self-pay | Admitting: Nurse Practitioner

## 2021-03-04 DIAGNOSIS — E1165 Type 2 diabetes mellitus with hyperglycemia: Secondary | ICD-10-CM

## 2021-03-04 DIAGNOSIS — E785 Hyperlipidemia, unspecified: Secondary | ICD-10-CM

## 2021-03-04 MED ORDER — METFORMIN HCL 500 MG PO TABS: 500.0000 mg | ORAL_TABLET | Freq: Every day | ORAL | 1 refills | Status: DC

## 2021-03-04 MED ORDER — ATORVASTATIN CALCIUM 20 MG PO TABS: 20.0000 mg | ORAL_TABLET | Freq: Every day | ORAL | 1 refills | Status: DC

## 2021-03-04 NOTE — Progress Notes (Signed)
Virtual Visit via Telephone Note Due to national recommendations of social distancing due to COVID 19, telehealth visit is felt to be most appropriate for this patient at this time.  I discussed the limitations, risks, security and privacy concerns of performing an evaluation and management service by telephone and the availability of in person appointments. I also discussed with the patient that there may be a patient responsible charge related to this service. The patient expressed understanding and agreed to proceed.    I connected with Benjamin Mccarthy on 03/04/21  at   3:50 PM EST  EDT by telephone and verified that I am speaking with the correct person using two identifiers.  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   DM 2 Well controlled. He is taking metformin 500 mg daily as prescribed. He does not have his log with him as he states he is at work. Denies any symptoms of hypo or hyperglycemia.  LDL not quite at goal. He is taking atorvastatin as prescribed Lab Results  Component Value Date   HGBA1C 6.6 (H) 11/05/2020    Lab Results  Component Value Date   LDLCALC 109 (H) 11/05/2020      Past Medical History:  Diagnosis Date   LTBI (latent tuberculosis infection) 07/10/2019   Type 2 diabetes mellitus with hyperglycemia, without long-term current use of insulin (HCC) 03/14/2020    Past Surgical History:  Procedure Laterality Date   NO PAST SURGERIES      Family History  Problem Relation Age of Onset   Unexplained death Father    Diabetes Neg Hx      Social History   Socioeconomic History   Marital status: Married    Spouse name: Not on file   Number of children: Not on file   Years of education: Not on file   Highest education level: Not on file  Occupational History   Not on file  Tobacco Use   Smoking status: Never   Smokeless tobacco: Never  Substance and Sexual Activity   Alcohol use: No   Drug use: No   Sexual activity: Yes  Other Topics Concern   Not on file  Social History Narrative   Not on file   Social Determinants of Health   Financial Resource Strain: Not on file  Food Insecurity: Not on file  Transportation Needs: Not on file  Physical Activity: Not on file  Stress: Not on file  Social Connections: Not on file     Observations/Objective: Awake, alert and oriented x 3   Review of Systems  Constitutional:  Negative for fever, malaise/fatigue and weight loss.  HENT: Negative.  Negative for nosebleeds.   Eyes: Negative.  Negative for blurred vision, double vision and photophobia.  Respiratory: Negative.  Negative for cough and shortness of breath.   Cardiovascular: Negative.  Negative for chest pain, palpitations and leg swelling.  Gastrointestinal: Negative.  Negative for heartburn, nausea and vomiting.  Musculoskeletal: Negative.  Negative for myalgias.  Neurological: Negative.  Negative for dizziness, focal weakness, seizures and headaches.  Psychiatric/Behavioral: Negative.  Negative for suicidal ideas.    Assessment and Plan: Diagnoses  and all orders for this visit:  Type 2 diabetes mellitus with hyperglycemia, unspecified whether long term insulin use (HCC) -     metFORMIN (GLUCOPHAGE) 500 MG tablet; Take 1 tablet (500 mg total) by mouth daily with breakfast.  Dyslipidemia, goal LDL below 70 -     atorvastatin (LIPITOR) 20 MG tablet; Take 1 tablet (20 mg total) by mouth daily. REPEAT LIPID panel at next office visit.     Follow Up Instructions Return in about 2 months (around 05/02/2021)  for HTN/HPL/DM.     I discussed the assessment and treatment plan with the patient. The patient was provided an opportunity to ask questions and all were answered. The patient agreed with the plan and demonstrated an understanding of the instructions.   The patient was advised to call back or seek an in-person evaluation if the symptoms worsen or if the condition fails to improve as anticipated.  I provided 10 minutes of non-face-to-face time during this encounter including median intraservice time, reviewing previous notes, labs, imaging, medications and explaining diagnosis and management.  Claiborne Rigg, FNP-BC

## 2021-03-07 IMAGING — CR DG CHEST 2V
2 series · 2 of 2 positions shown · non-contrast
Comparison: CT 10/10/2014

CLINICAL DATA: Positive PPD test

EXAM:
CHEST - 2 VIEW

[w chest pa]
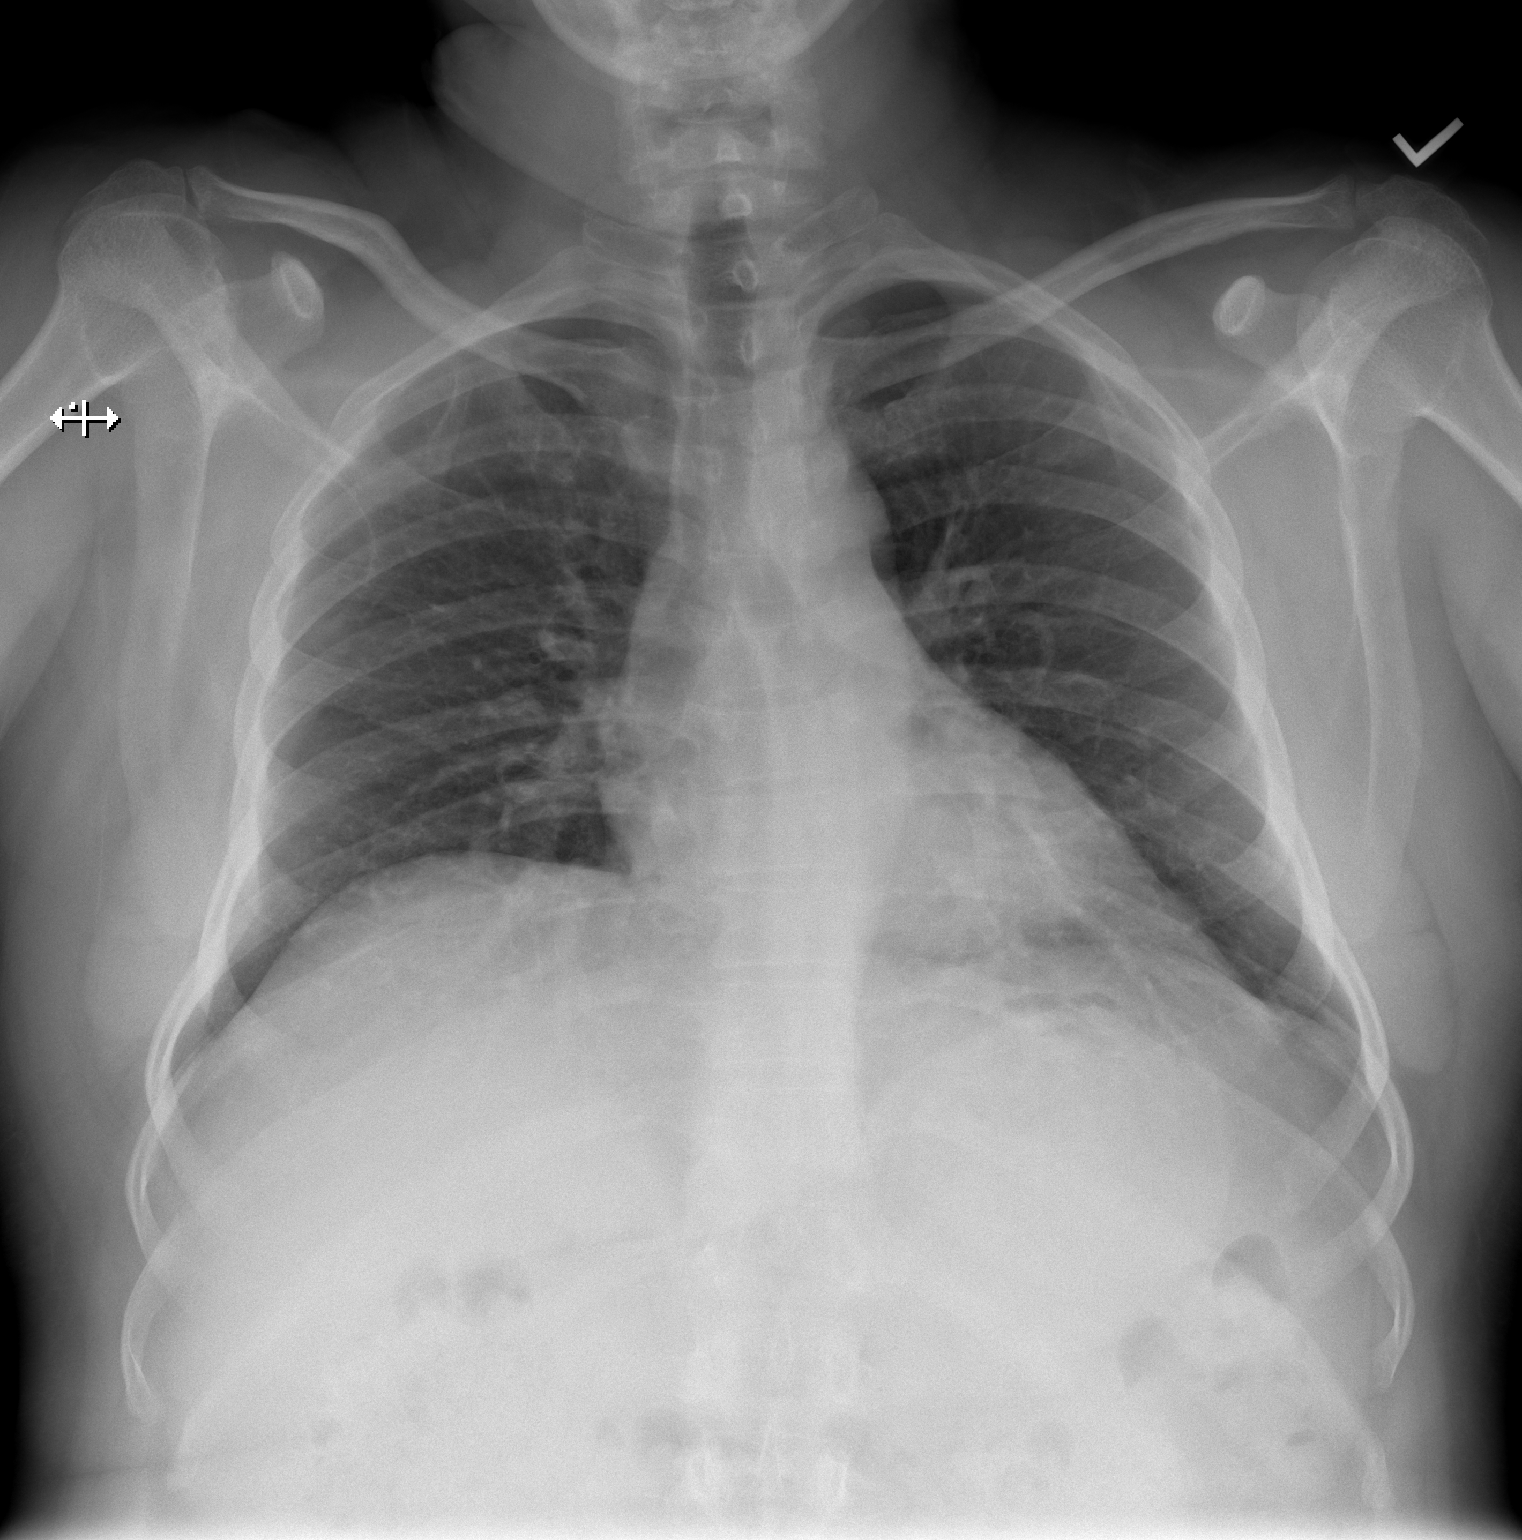

[w chest lat]
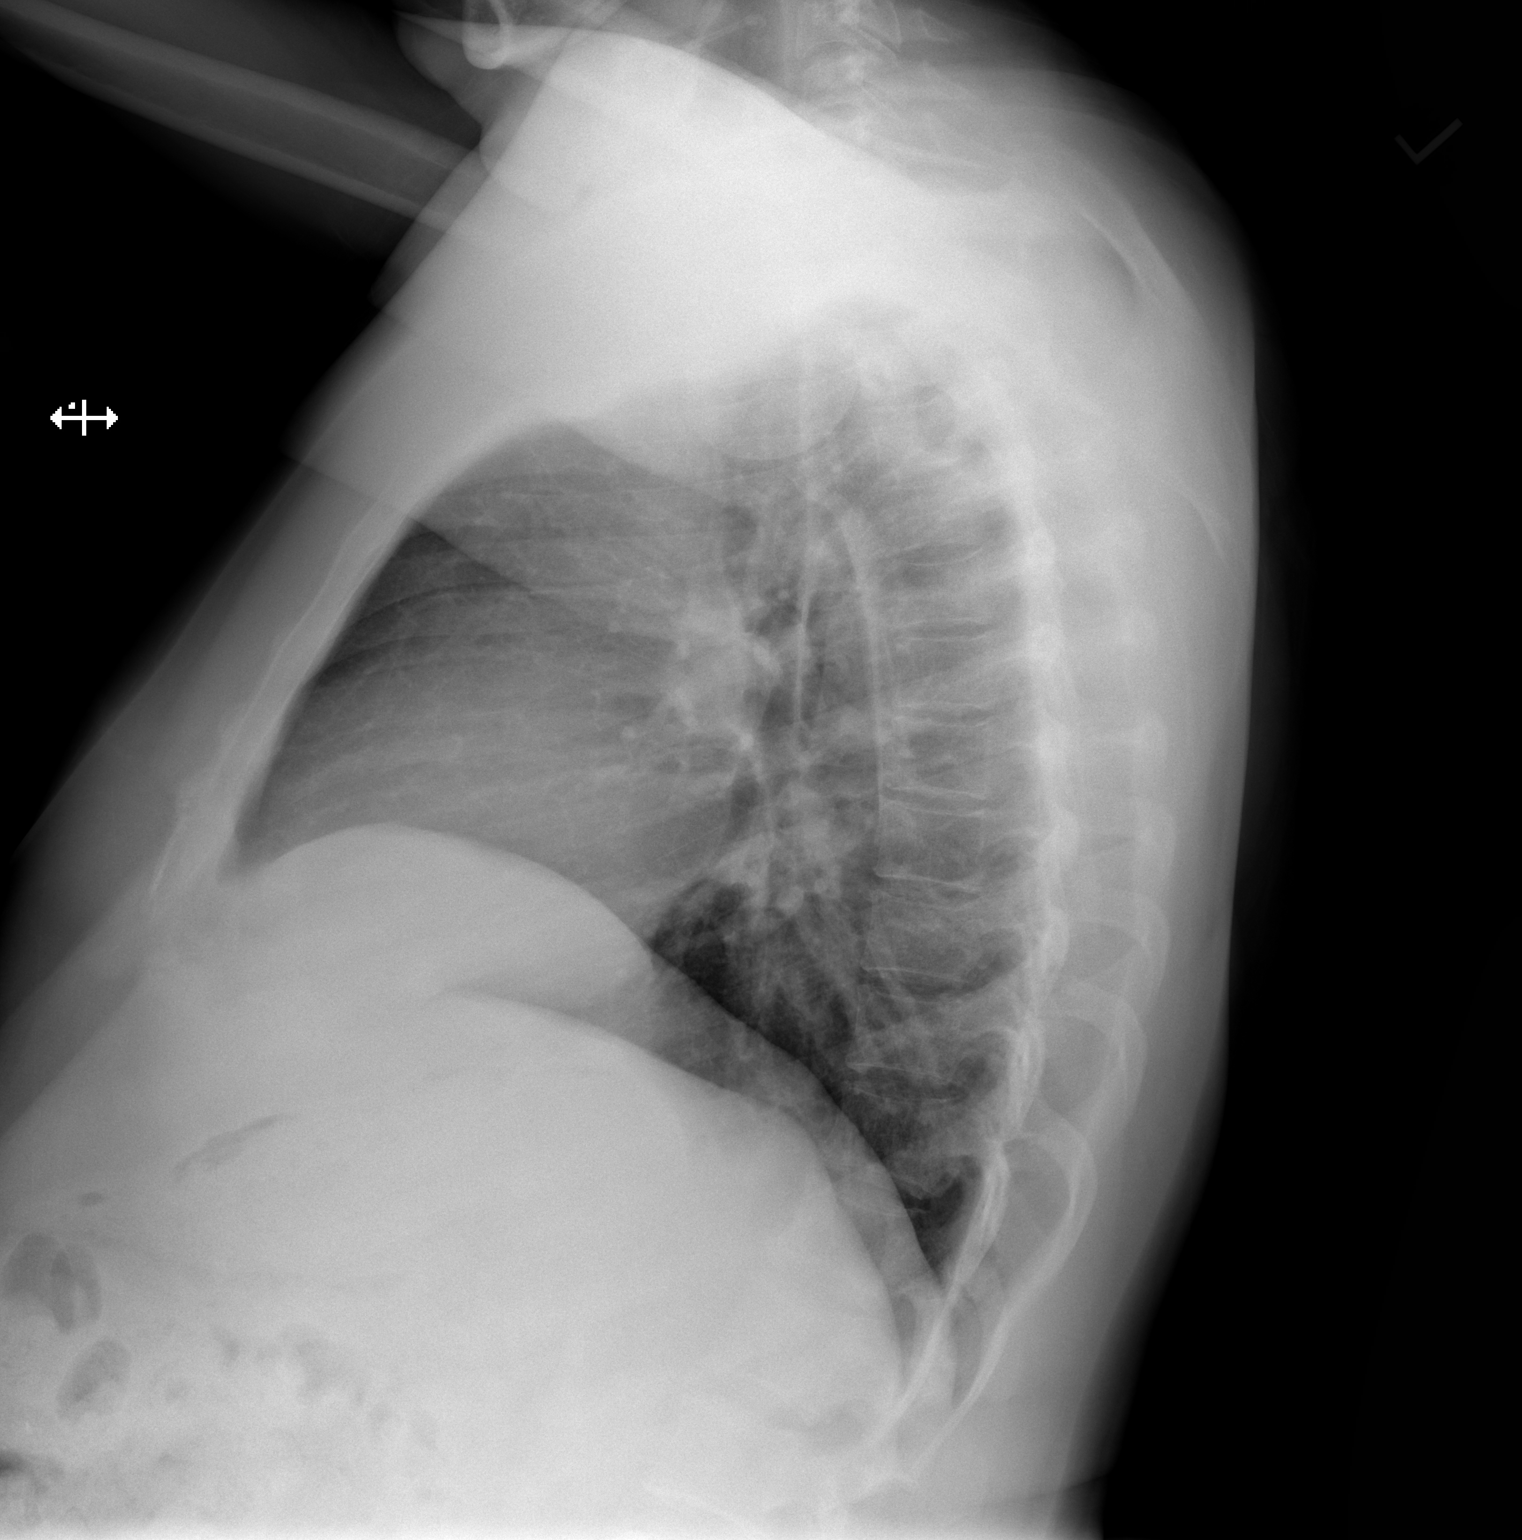

[2 of 2 positions shown; findings below may reference images not displayed]

FINDINGS: Midline trachea. Normal heart size and mediastinal contours. No
pleural effusion or pneumothorax. Clear lungs.
IMPRESSION: Normal chest.

## 2021-03-12 ENCOUNTER — Other Ambulatory Visit: Payer: Self-pay

## 2021-03-12 ENCOUNTER — Ambulatory Visit: Payer: No Typology Code available for payment source | Attending: Nurse Practitioner

## 2021-03-13 ENCOUNTER — Other Ambulatory Visit: Payer: Self-pay | Admitting: Nurse Practitioner

## 2021-03-13 DIAGNOSIS — E785 Hyperlipidemia, unspecified: Secondary | ICD-10-CM

## 2021-03-13 DIAGNOSIS — E1165 Type 2 diabetes mellitus with hyperglycemia: Secondary | ICD-10-CM

## 2021-03-14 LAB — CMP14+EGFR
ALT: 38 IU/L (ref 0–44)
AST: 31 IU/L (ref 0–40)
Albumin/Globulin Ratio: 1.5 (ref 1.2–2.2)
Albumin: 4.3 g/dL (ref 4.0–5.0)
Alkaline Phosphatase: 134 IU/L — ABNORMAL HIGH (ref 44–121)
BUN/Creatinine Ratio: 14 (ref 9–20)
BUN: 18 mg/dL (ref 6–24)
Bilirubin Total: 0.7 mg/dL (ref 0.0–1.2)
CO2: 22 mmol/L (ref 20–29)
Calcium: 9 mg/dL (ref 8.7–10.2)
Chloride: 103 mmol/L (ref 96–106)
Creatinine, Ser: 1.26 mg/dL (ref 0.76–1.27)
Globulin, Total: 2.9 g/dL (ref 1.5–4.5)
Glucose: 109 mg/dL — ABNORMAL HIGH (ref 70–99)
Potassium: 4 mmol/L (ref 3.5–5.2)
Sodium: 139 mmol/L (ref 134–144)
Total Protein: 7.2 g/dL (ref 6.0–8.5)
eGFR: 69 mL/min/{1.73_m2} (ref >59)

## 2021-03-14 LAB — LIPID PANEL
Chol/HDL Ratio: 5.4 ratio — ABNORMAL HIGH (ref 0.0–5.0)
Cholesterol, Total: 188 mg/dL (ref 100–199)
HDL: 35 mg/dL — ABNORMAL LOW (ref >39)
LDL Chol Calc (NIH): 98 mg/dL (ref 0–99)
Triglycerides: 323 mg/dL — ABNORMAL HIGH (ref 0–149)
VLDL Cholesterol Cal: 55 mg/dL — ABNORMAL HIGH (ref 5–40)

## 2021-03-14 LAB — HEMOGLOBIN A1C
Est. average glucose Bld gHb Est-mCnc: 143 mg/dL
Hgb A1c MFr Bld: 6.6 % — ABNORMAL HIGH (ref 4.8–5.6)

## 2021-03-17 ENCOUNTER — Telehealth: Payer: Self-pay

## 2021-03-17 NOTE — Telephone Encounter (Signed)
Patient returned call and notified of lab result and provider recommendations.

## 2021-03-17 NOTE — Telephone Encounter (Signed)
Left message to return call to our office.  With the help of Estrella Myrtle # [phone]

## 2021-03-17 NOTE — Telephone Encounter (Signed)
-----   Message from Gildardo Pounds, NP sent at 03/17/2021 11:23 AM EST ----- A1c still at 6.6.  Liver enzymes have improved.  Continue cholesterol medication and low-fat low-cholesterol diet.

## 2021-05-08 ENCOUNTER — Other Ambulatory Visit: Payer: Self-pay

## 2021-05-08 ENCOUNTER — Ambulatory Visit: Payer: Self-pay | Attending: Nurse Practitioner

## 2021-05-11 ENCOUNTER — Ambulatory Visit: Payer: No Typology Code available for payment source | Admitting: Nurse Practitioner

## 2021-07-26 ENCOUNTER — Encounter: Payer: Self-pay | Admitting: Infectious Diseases

## 2021-11-15 NOTE — Progress Notes (Signed)
Patient was sent to [facility] to see hand surgeon Dr. Apolonio Schneiders.  For this injury ER had referred patient over to his clinic for treatment.  Patient came to our office in error.

## 2022-01-12 ENCOUNTER — Other Ambulatory Visit: Payer: Self-pay

## 2022-01-12 MED ORDER — FLUARIX QUADRIVALENT 0.5 ML IM SUSY
PREFILLED_SYRINGE | INTRAMUSCULAR | 0 refills | Status: AC
  Filled 2022-01-12: qty 0.5, 1d supply, fill #0

## 2022-03-24 ENCOUNTER — Ambulatory Visit: Payer: Self-pay | Attending: Nurse Practitioner | Admitting: Nurse Practitioner

## 2022-06-29 ENCOUNTER — Ambulatory Visit: Payer: Self-pay | Attending: Nurse Practitioner

## 2022-07-20 ENCOUNTER — Ambulatory Visit: Payer: Self-pay | Attending: Nurse Practitioner | Admitting: Nurse Practitioner

## 2022-07-20 ENCOUNTER — Encounter: Payer: Self-pay | Admitting: Nurse Practitioner

## 2022-07-20 VITALS — BP 113/72 | HR 72 | Ht 63.0 in | Wt 193.4 lb

## 2022-07-20 DIAGNOSIS — Z1159 Encounter for screening for other viral diseases: Secondary | ICD-10-CM

## 2022-07-20 DIAGNOSIS — E1165 Type 2 diabetes mellitus with hyperglycemia: Secondary | ICD-10-CM

## 2022-07-20 DIAGNOSIS — E785 Hyperlipidemia, unspecified: Secondary | ICD-10-CM

## 2022-07-20 DIAGNOSIS — Z125 Encounter for screening for malignant neoplasm of prostate: Secondary | ICD-10-CM

## 2022-07-20 DIAGNOSIS — Z7984 Long term (current) use of oral hypoglycemic drugs: Secondary | ICD-10-CM

## 2022-07-20 DIAGNOSIS — Z0000 Encounter for general adult medical examination without abnormal findings: Secondary | ICD-10-CM

## 2022-07-20 MED ORDER — ATORVASTATIN CALCIUM 20 MG PO TABS: 20.0000 mg | ORAL_TABLET | Freq: Every day | ORAL | 1 refills | Status: DC

## 2022-07-20 MED ORDER — METFORMIN HCL 500 MG PO TABS: 500.0000 mg | ORAL_TABLET | Freq: Every day | ORAL | 1 refills | Status: DC

## 2022-07-20 NOTE — Progress Notes (Signed)
Assessment & Plan:  Benjamin Mccarthy was seen today for annual exam.  Diagnoses and all orders for this visit:  Encounter for annual physical exam Follow up 6 months a1c  Type 2 diabetes mellitus with hyperglycemia, without long-term current use of insulin  -     Microalbumin / creatinine urine ratio -     Hemoglobin A1c -     CMP14+EGFR -     metFORMIN (GLUCOPHAGE) 500 MG tablet; Take 1 tablet (500 mg total) by mouth daily with breakfast. Continue blood sugar control as discussed in office today, low carbohydrate diet, and regular physical exercise as tolerated, 150 minutes per week (30 min each day, 5 days per week, or 50 min 3 days per week). Keep blood sugar logs with fasting goal of 90-130 mg/dl, post prandial (after you eat) less than 180.  For Hypoglycemia: BS <60 and Hyperglycemia BS >400; contact the clinic ASAP. Annual eye exams and foot exams are recommended.   Encounter for hepatitis C screening test for low risk patient -     HCV Ab w Reflex to Quant PCR  Dyslipidemia, goal LDL below 70 -     Lipid panel -     atorvastatin (LIPITOR) 20 MG tablet; Take 1 tablet (20 mg total) by mouth daily. For cholesterol    Patient has been counseled on age-appropriate routine health concerns for screening and prevention. These are reviewed and up-to-date. Referrals have been placed accordingly. Immunizations are up-to-date or declined.    Subjective:   Chief Complaint  Patient presents with   Annual Exam   HPI Benjamin Mccarthy 52 y.o. male presents to office today for annual physical exam  DM 2 Well controlled with metformin 500 mg daily.  Lab Results  Component Value Date   HGBA1C 6.6 (H) 03/12/2021   Lab Results  Component Value Date   LDLCALC 98 03/12/2021     Review of Systems  Constitutional:  Negative for fever, malaise/fatigue and weight loss.  HENT: Negative.  Negative for nosebleeds.   Eyes: Negative.  Negative for blurred vision, double vision and  photophobia.  Respiratory: Negative.  Negative for cough and shortness of breath.   Cardiovascular: Negative.  Negative for chest pain, palpitations and leg swelling.  Gastrointestinal: Negative.  Negative for heartburn, nausea and vomiting.  Genitourinary: Negative.   Musculoskeletal: Negative.  Negative for myalgias.  Skin: Negative.   Neurological: Negative.  Negative for dizziness, focal weakness, seizures and headaches.  Endo/Heme/Allergies: Negative.   Psychiatric/Behavioral: Negative.  Negative for suicidal ideas.     Past Medical History:  Diagnosis Date   LTBI (latent tuberculosis infection) 07/10/2019   Type 2 diabetes mellitus with hyperglycemia, without long-term current use of insulin (HCC) 03/14/2020    Past Surgical History:  Procedure Laterality Date   NO PAST SURGERIES      Family History  Problem Relation Age of Onset   Unexplained death Father    Diabetes Neg Hx     Social History Reviewed with no changes to be made today.   Outpatient Medications Prior to Visit  Medication Sig Dispense Refill   influenza vac split quadrivalent PF (FLUARIX QUADRIVALENT) 0.5 ML injection Inject into the muscle. (Patient not taking: Reported on 07/20/2022) 0.5 mL 0   atorvastatin (LIPITOR) 20 MG tablet Take 1 tablet (20 mg total) by mouth daily. (Patient not taking: Reported on 07/20/2022) 90 tablet 1   metFORMIN (GLUCOPHAGE) 500 MG tablet Take 1 tablet (500 mg total) by mouth daily with  breakfast. 90 tablet 1   No facility-administered medications prior to visit.    No Known Allergies     Objective:    BP 113/72 (BP Location: Left Arm, Patient Position: Sitting, Cuff Size: Normal)   Pulse 72   Ht 5\' 3"  (1.6 m)   Wt 193 lb 6.4 oz (87.7 kg)   SpO2 98%   BMI 34.26 kg/m  Wt Readings from Last 3 Encounters:  07/20/22 193 lb 6.4 oz (87.7 kg)  03/14/20 193 lb (87.5 kg)  01/22/20 196 lb (88.9 kg)    Physical Exam Constitutional:      Appearance: He is well-developed.   HENT:     Head: Normocephalic and atraumatic.     Right Ear: Hearing, tympanic membrane, ear canal and external ear normal.     Left Ear: Hearing, tympanic membrane, ear canal and external ear normal.     Nose: Nose normal. No mucosal edema or rhinorrhea.     Right Turbinates: Not enlarged.     Left Turbinates: Not enlarged.     Mouth/Throat:     Lips: Pink.     Mouth: Mucous membranes are moist.     Dentition: No gingival swelling, dental abscesses or gum lesions.     Pharynx: Uvula midline.     Tonsils: No tonsillar exudate. 1+ on the right. 1+ on the left.  Eyes:     General: Lids are normal. No scleral icterus.    Extraocular Movements: Extraocular movements intact.     Conjunctiva/sclera: Conjunctivae normal.     Pupils: Pupils are equal, round, and reactive to light.  Neck:     Thyroid: No thyromegaly.     Trachea: No tracheal deviation.  Cardiovascular:     Rate and Rhythm: Normal rate and regular rhythm.     Heart sounds: Normal heart sounds. No murmur heard.    No friction rub. No gallop.  Pulmonary:     Effort: Pulmonary effort is normal. No respiratory distress.     Breath sounds: Normal breath sounds. No wheezing or rales.  Chest:     Chest wall: No mass or tenderness.  Breasts:    Right: No inverted nipple, mass, nipple discharge, skin change or tenderness.     Left: No inverted nipple, mass, nipple discharge, skin change or tenderness.  Abdominal:     General: Bowel sounds are normal. There is no distension.     Palpations: Abdomen is soft. There is no mass.     Tenderness: There is no abdominal tenderness. There is no guarding or rebound.  Musculoskeletal:        General: No tenderness or deformity. Normal range of motion.     Cervical back: Normal range of motion and neck supple.  Lymphadenopathy:     Cervical: No cervical adenopathy.  Skin:    General: Skin is warm and dry.     Capillary Refill: Capillary refill takes less than 2 seconds.     Findings:  No erythema.  Neurological:     Mental Status: He is alert and oriented to person, place, and time.     Cranial Nerves: No cranial nerve deficit.     Sensory: Sensation is intact.     Motor: No abnormal muscle tone.     Coordination: Coordination is intact. Coordination normal.     Gait: Gait is intact.     Deep Tendon Reflexes: Reflexes normal.     Reflex Scores:      Patellar reflexes are 1+ on  the right side and 1+ on the left side. Psychiatric:        Attention and Perception: Attention normal.        Mood and Affect: Mood normal.        Speech: Speech normal.        Behavior: Behavior normal.        Thought Content: Thought content normal.        Judgment: Judgment normal.          Patient has been counseled extensively about nutrition and exercise as well as the importance of adherence with medications and regular follow-up. The patient was given clear instructions to go to ER or return to medical center if symptoms don't improve, worsen or new problems develop. The patient verbalized understanding.   Follow-up: Return in about 6 months (around 01/20/2023) for a1c.   Claiborne Rigg, FNP-BC [facility] [place], [place] [phone]   07/20/2022, 3:21 PM

## 2022-07-21 LAB — MICROALBUMIN / CREATININE URINE RATIO
Creatinine, Urine: 111.5 mg/dL
Microalb/Creat Ratio: 17 mg/g creat (ref 0–29)
Microalbumin, Urine: 19.1 ug/mL

## 2022-07-28 ENCOUNTER — Other Ambulatory Visit: Payer: Self-pay | Admitting: Nurse Practitioner

## 2022-07-28 DIAGNOSIS — E1165 Type 2 diabetes mellitus with hyperglycemia: Secondary | ICD-10-CM

## 2022-07-28 LAB — HEMOGLOBIN A1C
Est. average glucose Bld gHb Est-mCnc: 146 mg/dL
Hgb A1c MFr Bld: 6.7 % — ABNORMAL HIGH (ref 4.8–5.6)

## 2022-07-28 LAB — CMP14+EGFR
ALT: 62 IU/L — ABNORMAL HIGH (ref 0–44)
AST: 39 IU/L (ref 0–40)
Albumin/Globulin Ratio: 1.5 (ref 1.2–2.2)
Albumin: 4.6 g/dL (ref 3.8–4.9)
Alkaline Phosphatase: 165 IU/L — ABNORMAL HIGH (ref 44–121)
BUN/Creatinine Ratio: 16 (ref 9–20)
BUN: 14 mg/dL (ref 6–24)
Bilirubin Total: 0.8 mg/dL (ref 0.0–1.2)
CO2: 21 mmol/L (ref 20–29)
Calcium: 9.6 mg/dL (ref 8.7–10.2)
Chloride: 100 mmol/L (ref 96–106)
Creatinine, Ser: 0.88 mg/dL (ref 0.76–1.27)
Globulin, Total: 3.1 g/dL (ref 1.5–4.5)
Glucose: 113 mg/dL — ABNORMAL HIGH (ref 70–99)
Potassium: 4.3 mmol/L (ref 3.5–5.2)
Sodium: 138 mmol/L (ref 134–144)
Total Protein: 7.7 g/dL (ref 6.0–8.5)
eGFR: 103 mL/min/{1.73_m2} (ref >59)

## 2022-07-28 LAB — LIPID PANEL
Chol/HDL Ratio: 5.3 ratio — ABNORMAL HIGH (ref 0.0–5.0)
Cholesterol, Total: 217 mg/dL — ABNORMAL HIGH (ref 100–199)
HDL: 41 mg/dL (ref >39)
LDL Chol Calc (NIH): 116 mg/dL — ABNORMAL HIGH (ref 0–99)
Triglycerides: 343 mg/dL — ABNORMAL HIGH (ref 0–149)
VLDL Cholesterol Cal: 60 mg/dL — ABNORMAL HIGH (ref 5–40)

## 2022-07-28 LAB — HCV INTERPRETATION

## 2022-07-28 LAB — PSA: Prostate Specific Ag, Serum: 0.3 ng/mL (ref 0.0–4.0)

## 2022-07-28 LAB — HCV AB W REFLEX TO QUANT PCR: HCV Ab: NONREACTIVE

## 2022-07-28 MED ORDER — METFORMIN HCL 500 MG PO TABS: 500.0000 mg | ORAL_TABLET | Freq: Two times a day (BID) | ORAL | 1 refills | Status: DC

## 2022-08-03 ENCOUNTER — Ambulatory Visit: Payer: Self-pay

## 2022-08-17 ENCOUNTER — Other Ambulatory Visit: Payer: Self-pay

## 2022-08-17 ENCOUNTER — Ambulatory Visit: Payer: Self-pay | Attending: Nurse Practitioner

## 2022-08-17 ENCOUNTER — Other Ambulatory Visit: Payer: Self-pay | Admitting: Pharmacist

## 2022-08-17 DIAGNOSIS — E1165 Type 2 diabetes mellitus with hyperglycemia: Secondary | ICD-10-CM

## 2022-08-17 DIAGNOSIS — E785 Hyperlipidemia, unspecified: Secondary | ICD-10-CM

## 2022-08-17 MED ORDER — TRUE METRIX METER W/DEVICE KIT
PACK | 0 refills | Status: AC
  Filled 2022-08-17: qty 1, 30d supply, fill #0

## 2022-08-17 MED ORDER — METFORMIN HCL 500 MG PO TABS
500.0000 mg | ORAL_TABLET | Freq: Two times a day (BID) | ORAL | 1 refills | Status: AC
Start: 2022-08-17 — End: ?
  Filled 2022-08-17: qty 180, 90d supply, fill #0

## 2022-08-17 MED ORDER — ATORVASTATIN CALCIUM 20 MG PO TABS
20.0000 mg | ORAL_TABLET | Freq: Every day | ORAL | 1 refills | Status: AC
Start: 2022-08-17 — End: ?
  Filled 2022-08-17: qty 90, 90d supply, fill #0

## 2022-08-17 MED ORDER — TRUEPLUS LANCETS 28G MISC
2 refills | Status: AC
Start: 2022-08-17 — End: ?
  Filled 2022-08-17: qty 100, 90d supply, fill #0

## 2022-08-17 MED ORDER — TRUE METRIX BLOOD GLUCOSE TEST VI STRP
ORAL_STRIP | 2 refills | Status: AC
  Filled 2022-08-17: qty 100, 90d supply, fill #0

## 2022-08-23 ENCOUNTER — Other Ambulatory Visit: Payer: Self-pay

## 2022-08-24 ENCOUNTER — Other Ambulatory Visit: Payer: Self-pay
